# Patient Record
Sex: Male | Born: 1955 | Race: White | Hispanic: No | Smoking: Never smoker
Health system: Southern US, Community
[De-identification: ages and names within clinical notes are randomized; demographics above are authoritative.]

## PROBLEM LIST (undated history)

## (undated) DIAGNOSIS — Z9889 Other specified postprocedural states: Secondary | ICD-10-CM

## (undated) DIAGNOSIS — T4145XA Adverse effect of unspecified anesthetic, initial encounter: Secondary | ICD-10-CM

## (undated) DIAGNOSIS — K4090 Unilateral inguinal hernia, without obstruction or gangrene, not specified as recurrent: Secondary | ICD-10-CM

## (undated) DIAGNOSIS — Z87442 Personal history of urinary calculi: Secondary | ICD-10-CM

## (undated) DIAGNOSIS — T8859XA Other complications of anesthesia, initial encounter: Secondary | ICD-10-CM

## (undated) DIAGNOSIS — R112 Nausea with vomiting, unspecified: Secondary | ICD-10-CM

## (undated) HISTORY — PX: VASECTOMY: SHX75

---

## 1998-03-11 ENCOUNTER — Emergency Department (HOSPITAL_COMMUNITY): Admission: EM | Admit: 1998-03-11 | Discharge: 1998-03-11 | Payer: Self-pay | Admitting: Emergency Medicine

## 1998-03-12 ENCOUNTER — Encounter: Payer: Self-pay | Admitting: Emergency Medicine

## 2001-06-05 ENCOUNTER — Encounter: Payer: Self-pay | Admitting: Urology

## 2001-06-05 ENCOUNTER — Ambulatory Visit (HOSPITAL_BASED_OUTPATIENT_CLINIC_OR_DEPARTMENT_OTHER): Admission: RE | Admit: 2001-06-05 | Discharge: 2001-06-05 | Payer: Self-pay | Admitting: Urology

## 2003-03-30 ENCOUNTER — Ambulatory Visit (HOSPITAL_COMMUNITY): Admission: RE | Admit: 2003-03-30 | Discharge: 2003-03-30 | Payer: Self-pay | Admitting: Ophthalmology

## 2004-07-24 ENCOUNTER — Ambulatory Visit: Payer: Self-pay | Admitting: Internal Medicine

## 2005-10-18 ENCOUNTER — Ambulatory Visit: Payer: Self-pay | Admitting: Internal Medicine

## 2006-12-07 ENCOUNTER — Encounter: Payer: Self-pay | Admitting: *Deleted

## 2007-06-11 ENCOUNTER — Encounter: Payer: Self-pay | Admitting: *Deleted

## 2007-06-11 DIAGNOSIS — K649 Unspecified hemorrhoids: Secondary | ICD-10-CM | POA: Insufficient documentation

## 2007-06-11 DIAGNOSIS — Z9889 Other specified postprocedural states: Secondary | ICD-10-CM | POA: Insufficient documentation

## 2007-09-08 ENCOUNTER — Telehealth: Payer: Self-pay | Admitting: Internal Medicine

## 2008-05-19 ENCOUNTER — Ambulatory Visit (HOSPITAL_BASED_OUTPATIENT_CLINIC_OR_DEPARTMENT_OTHER): Admission: RE | Admit: 2008-05-19 | Discharge: 2008-05-19 | Payer: Self-pay | Admitting: Urology

## 2008-05-19 ENCOUNTER — Encounter (INDEPENDENT_AMBULATORY_CARE_PROVIDER_SITE_OTHER): Payer: Self-pay | Admitting: Urology

## 2008-09-27 ENCOUNTER — Telehealth: Payer: Self-pay | Admitting: Internal Medicine

## 2008-10-18 ENCOUNTER — Ambulatory Visit: Payer: Self-pay | Admitting: Gastroenterology

## 2008-10-20 ENCOUNTER — Telehealth: Payer: Self-pay | Admitting: Gastroenterology

## 2008-11-01 ENCOUNTER — Ambulatory Visit: Payer: Self-pay | Admitting: Gastroenterology

## 2008-11-15 ENCOUNTER — Ambulatory Visit: Payer: Self-pay | Admitting: Internal Medicine

## 2008-11-16 LAB — CONVERTED CEMR LAB
ALT: 15 units/L (ref 0–53)
AST: 19 units/L (ref 0–37)
Albumin: 4.2 g/dL (ref 3.5–5.2)
Alkaline Phosphatase: 69 units/L (ref 39–117)
BUN: 22 mg/dL (ref 6–23)
Basophils Absolute: 0 10*3/uL (ref 0.0–0.1)
Basophils Relative: 0.3 % (ref 0.0–3.0)
Bilirubin Urine: NEGATIVE
Bilirubin, Direct: 0.1 mg/dL (ref 0.0–0.3)
CO2: 29 meq/L (ref 19–32)
Calcium: 9 mg/dL (ref 8.4–10.5)
Chloride: 106 meq/L (ref 96–112)
Cholesterol: 188 mg/dL (ref 0–200)
Creatinine, Ser: 1.3 mg/dL (ref 0.4–1.5)
Eosinophils Absolute: 0.1 10*3/uL (ref 0.0–0.7)
Eosinophils Relative: 1.5 % (ref 0.0–5.0)
GFR calc non Af Amer: 61.83 mL/min (ref >60)
Glucose, Bld: 93 mg/dL (ref 70–99)
HCT: 47.2 % (ref 39.0–52.0)
HDL: 34.2 mg/dL — ABNORMAL LOW (ref >39.00)
Hemoglobin, Urine: NEGATIVE
Hemoglobin: 16.1 g/dL (ref 13.0–17.0)
Ketones, ur: NEGATIVE mg/dL
LDL Cholesterol: 126 mg/dL — ABNORMAL HIGH (ref 0–99)
Leukocytes, UA: NEGATIVE
Lymphocytes Relative: 24 % (ref 12.0–46.0)
Lymphs Abs: 1.8 10*3/uL (ref 0.7–4.0)
MCHC: 34.1 g/dL (ref 30.0–36.0)
MCV: 91.3 fL (ref 78.0–100.0)
Monocytes Absolute: 0.6 10*3/uL (ref 0.1–1.0)
Monocytes Relative: 7.7 % (ref 3.0–12.0)
Neutro Abs: 5 10*3/uL (ref 1.4–7.7)
Neutrophils Relative %: 66.5 % (ref 43.0–77.0)
Nitrite: NEGATIVE
PSA: 0.54 ng/mL (ref 0.10–4.00)
Platelets: 141 10*3/uL — ABNORMAL LOW (ref 150.0–400.0)
Potassium: 4.4 meq/L (ref 3.5–5.1)
RBC: 5.17 M/uL (ref 4.22–5.81)
RDW: 12 % (ref 11.5–14.6)
Sodium: 141 meq/L (ref 135–145)
Specific Gravity, Urine: 1.02 (ref 1.000–1.030)
TSH: 5.68 microintl units/mL — ABNORMAL HIGH (ref 0.35–5.50)
Total Bilirubin: 0.9 mg/dL (ref 0.3–1.2)
Total CHOL/HDL Ratio: 5
Total Protein, Urine: NEGATIVE mg/dL
Total Protein: 6.6 g/dL (ref 6.0–8.3)
Triglycerides: 140 mg/dL (ref 0.0–149.0)
Urine Glucose: NEGATIVE mg/dL
Urobilinogen, UA: 0.2 (ref 0.0–1.0)
VLDL: 28 mg/dL (ref 0.0–40.0)
WBC: 7.5 10*3/uL (ref 4.5–10.5)
pH: 6 (ref 5.0–8.0)

## 2008-11-22 ENCOUNTER — Ambulatory Visit: Payer: Self-pay | Admitting: Internal Medicine

## 2008-11-22 ENCOUNTER — Telehealth (INDEPENDENT_AMBULATORY_CARE_PROVIDER_SITE_OTHER): Payer: Self-pay | Admitting: *Deleted

## 2008-11-22 DIAGNOSIS — N508 Other specified disorders of male genital organs: Secondary | ICD-10-CM | POA: Insufficient documentation

## 2009-03-11 ENCOUNTER — Ambulatory Visit: Payer: Self-pay | Admitting: Internal Medicine

## 2009-03-11 DIAGNOSIS — M545 Low back pain, unspecified: Secondary | ICD-10-CM | POA: Insufficient documentation

## 2009-03-11 IMAGING — CR DG HIP COMPLETE 2+V*R*
3 series · 3 of 3 positions shown · non-contrast
Comparison: None

CLINICAL DATA: Right hip pain.

RIGHT HIP - COMPLETE 2+ VIEW

[view not recorded (1 of 3)]
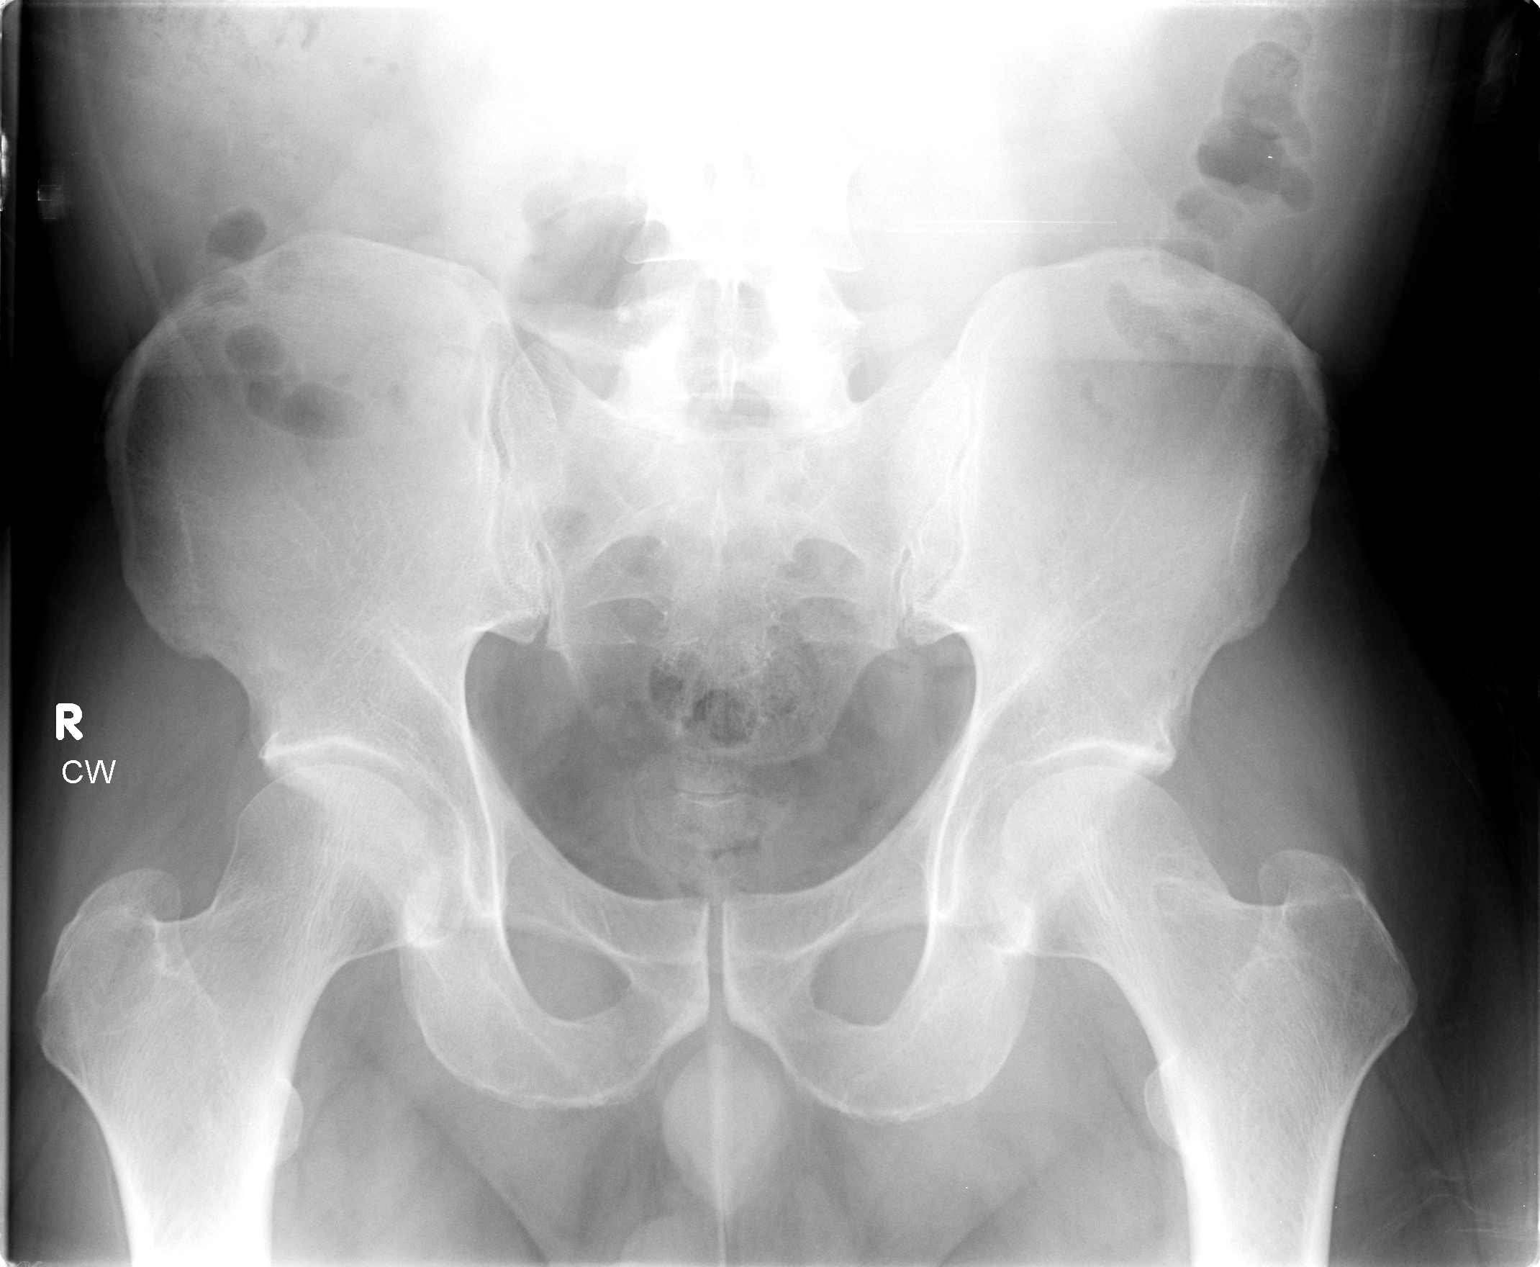

[view not recorded (2 of 3)]
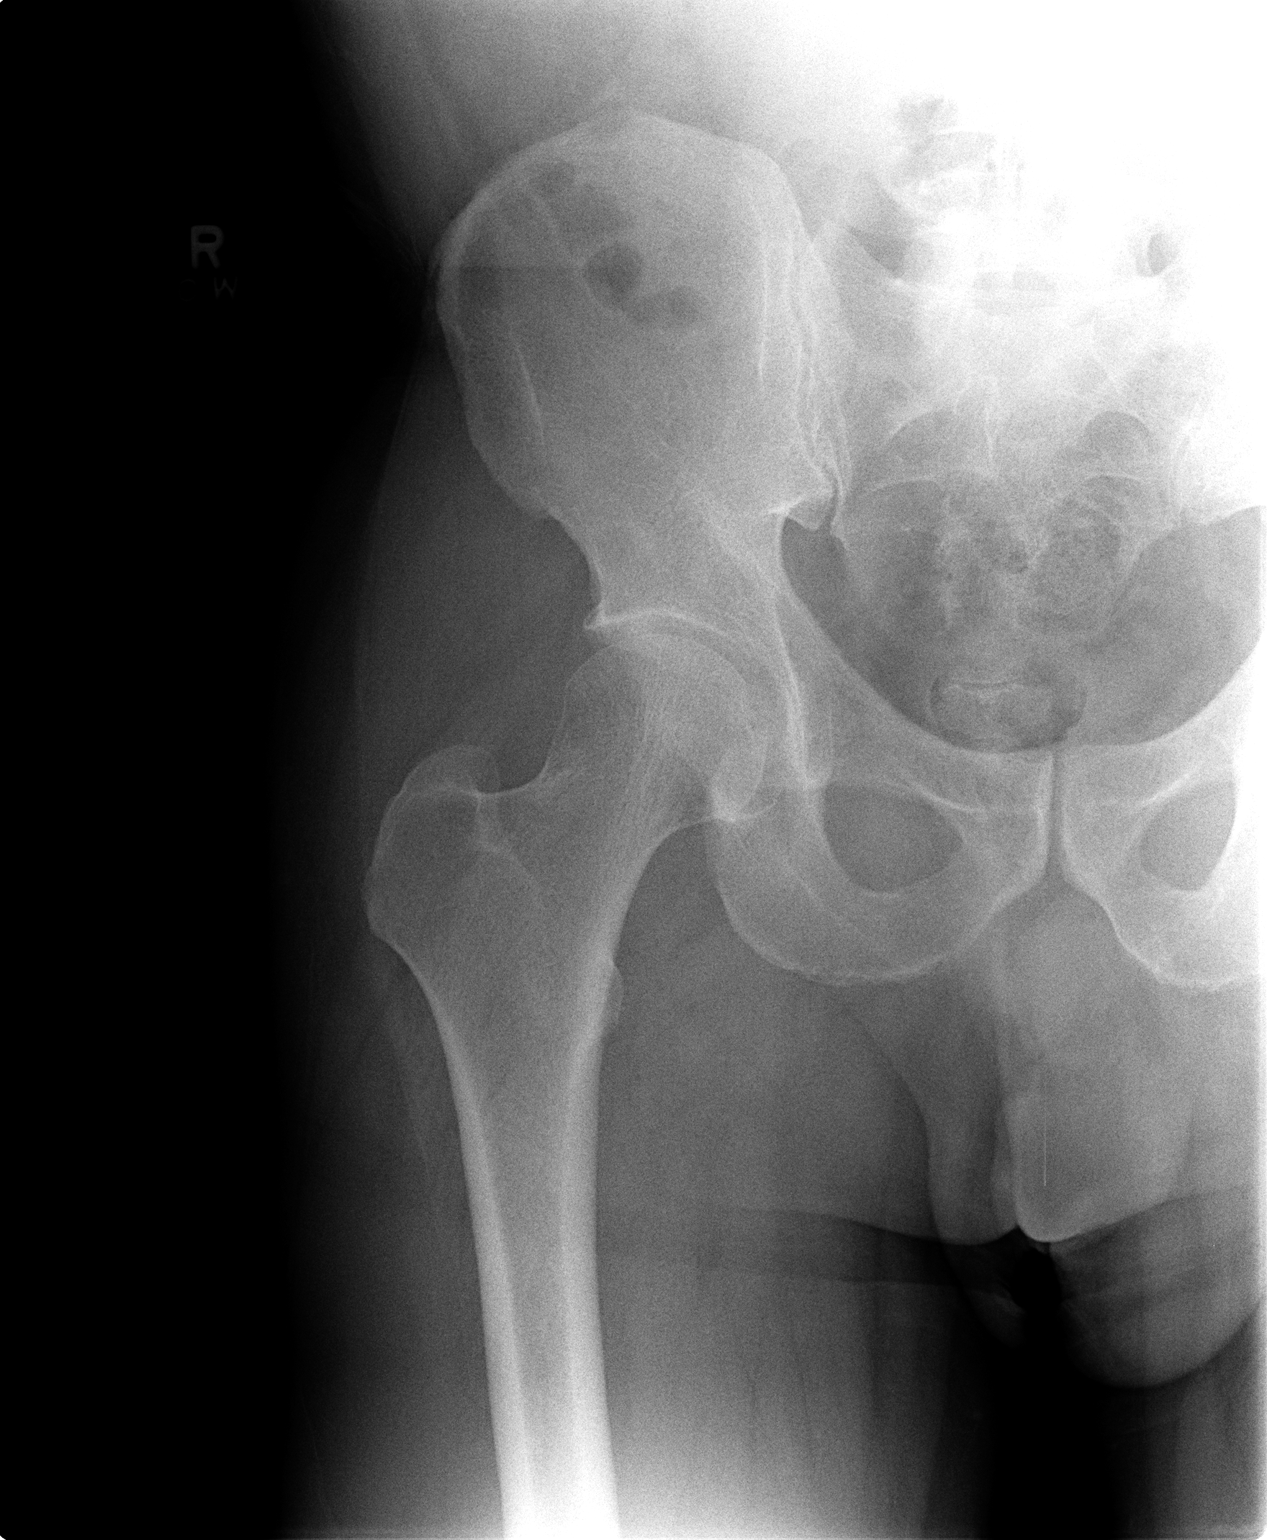

[view not recorded (3 of 3)]
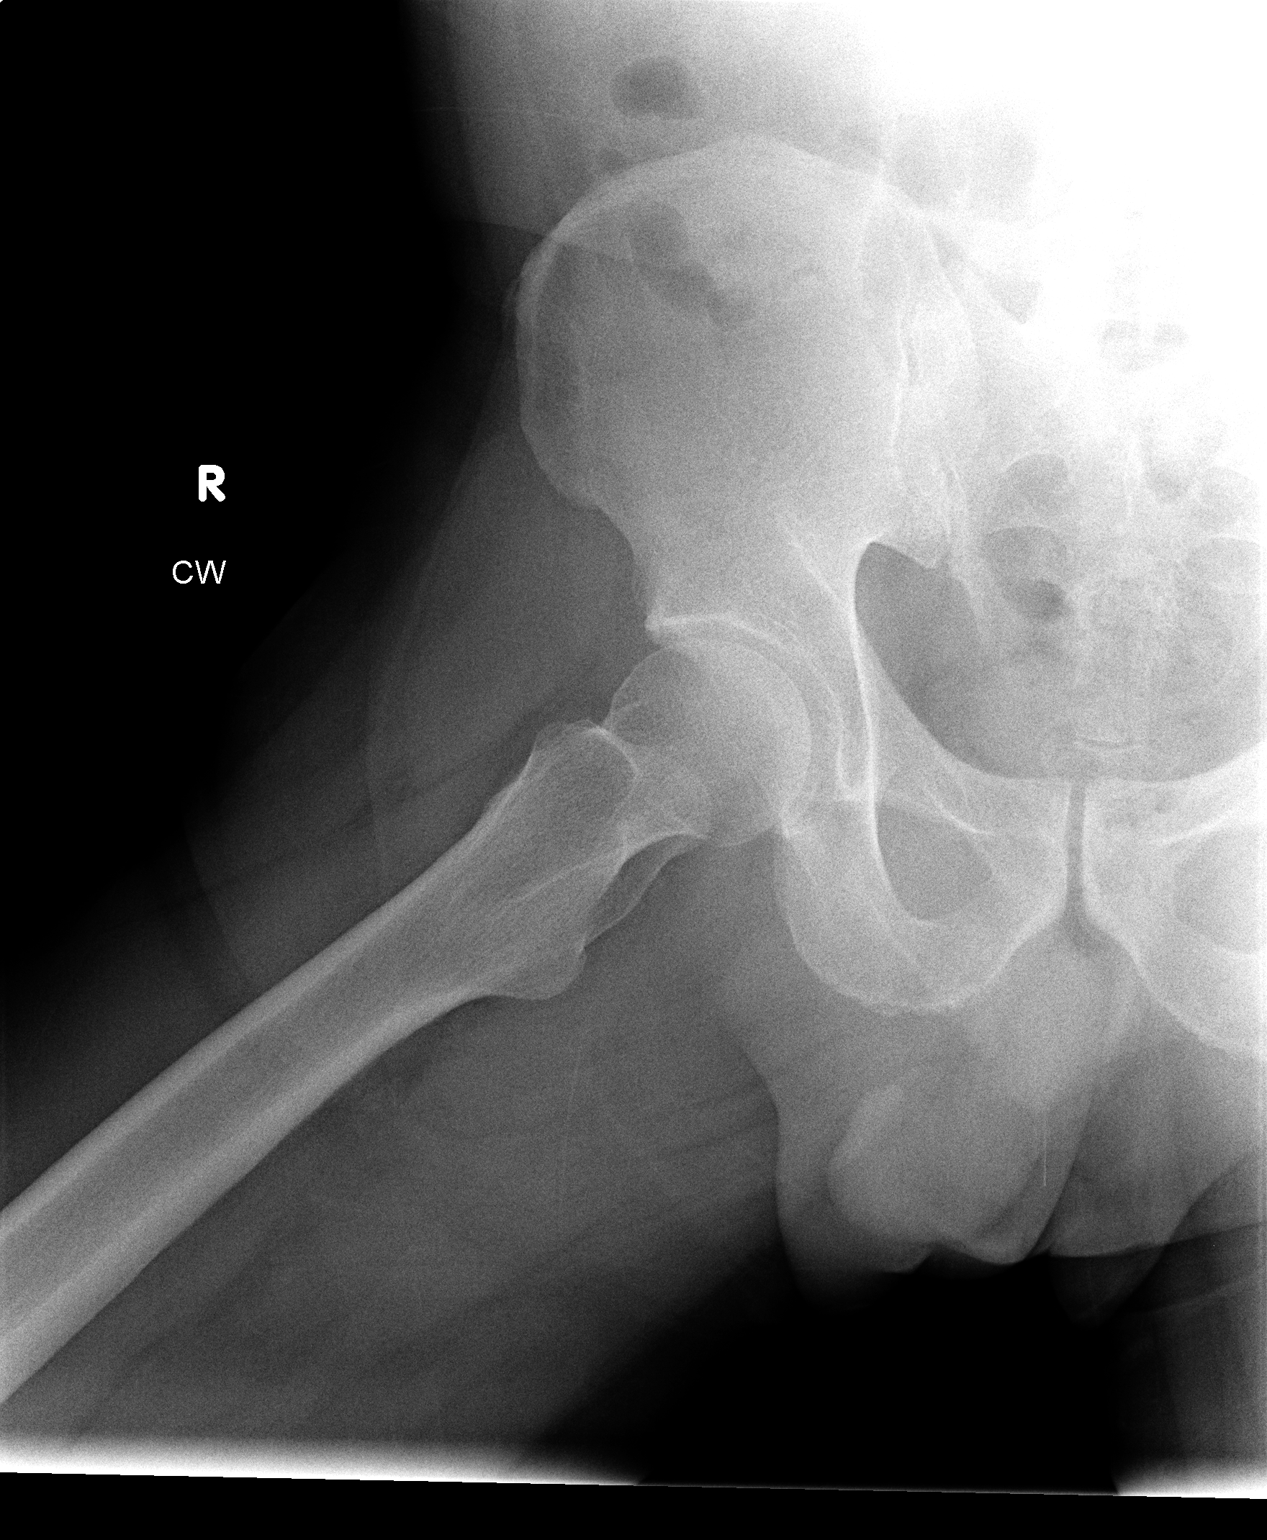

[3 of 3 positions shown; findings below may reference images not displayed]

FINDINGS: No evidence of acute fracture, subluxation or dislocation
identified.

No radio-opaque foreign bodies are present.

No focal bony lesions are noted.

The joint spaces are unremarkable.
IMPRESSION: Unremarkable right hip.

## 2009-03-11 IMAGING — CR DG SI JOINTS 3+V
4 series · 4 of 4 positions shown · non-contrast
Comparison: None

CLINICAL DATA: Low back pain.  Right sacroiliac pain.

BILATERAL SACROILIAC JOINTS - 3+ VIEW

[view not recorded (1 of 4)]
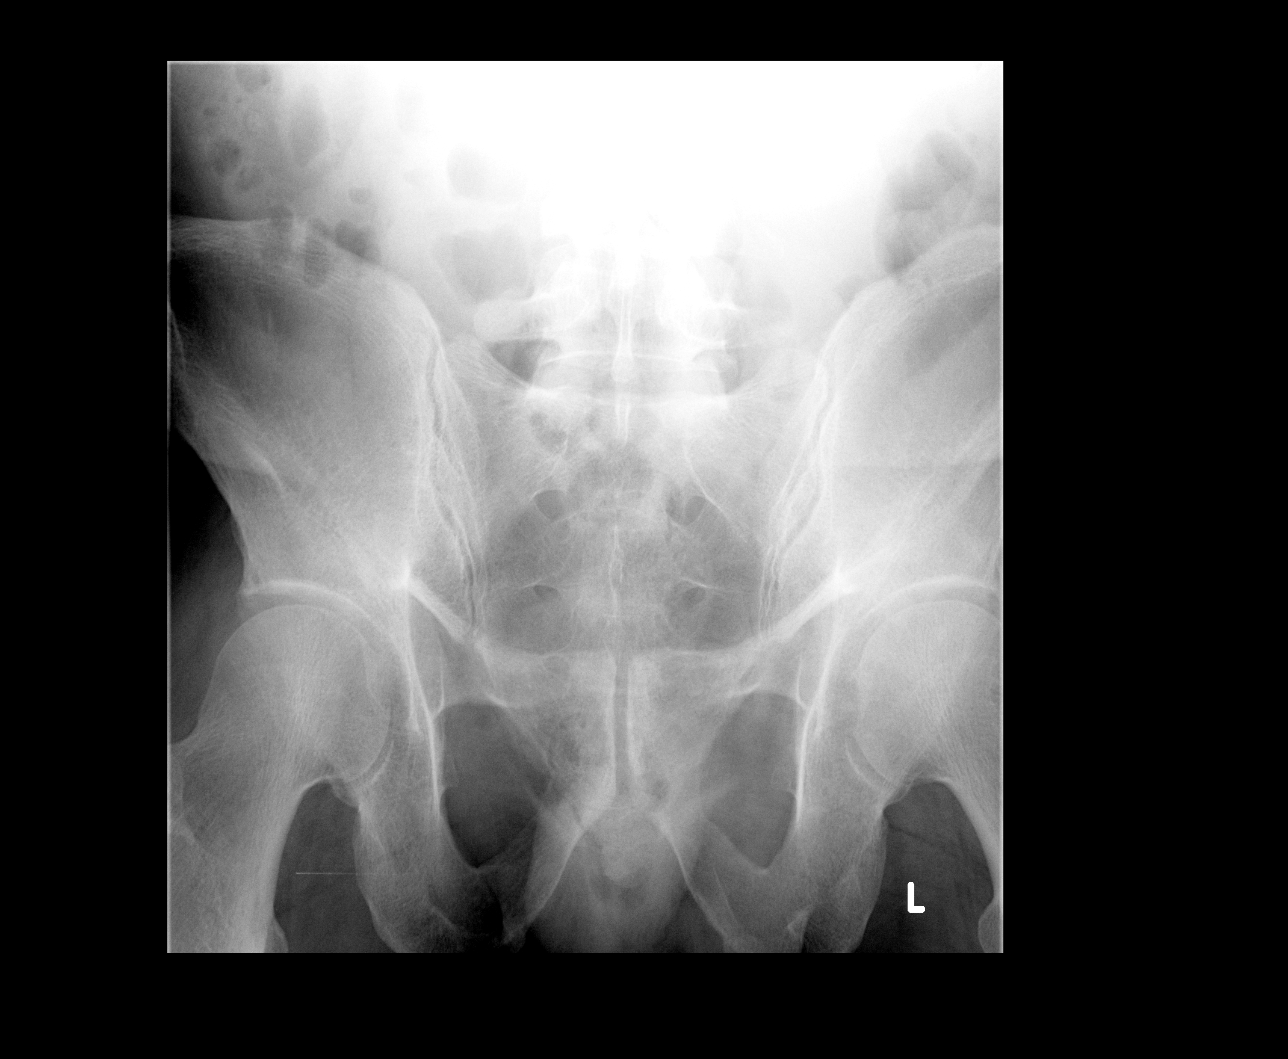

[view not recorded (2 of 4)]
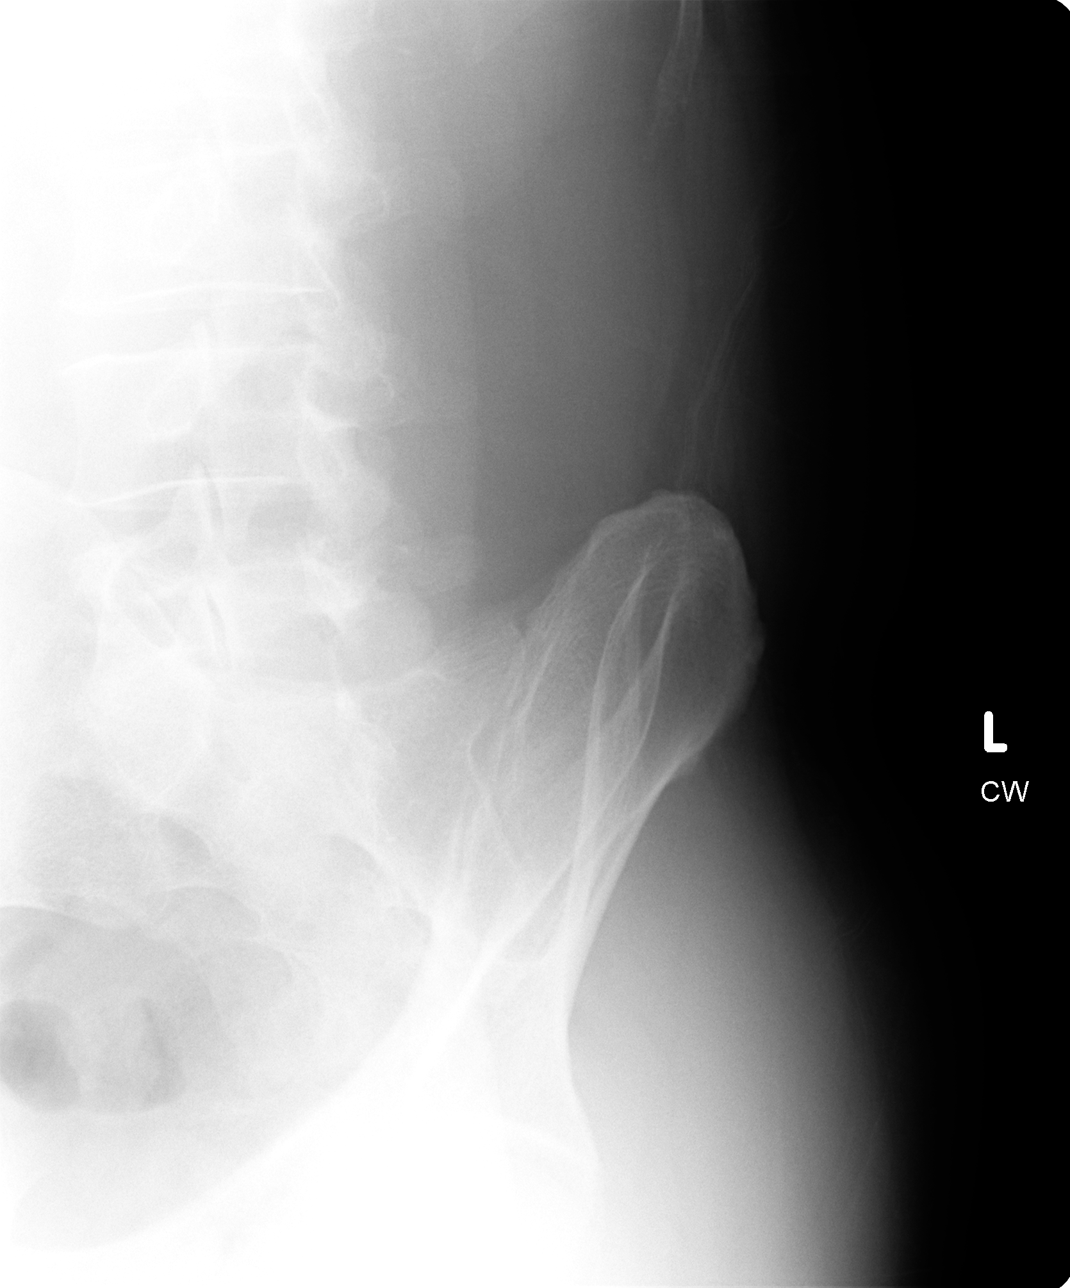

[view not recorded (3 of 4)]
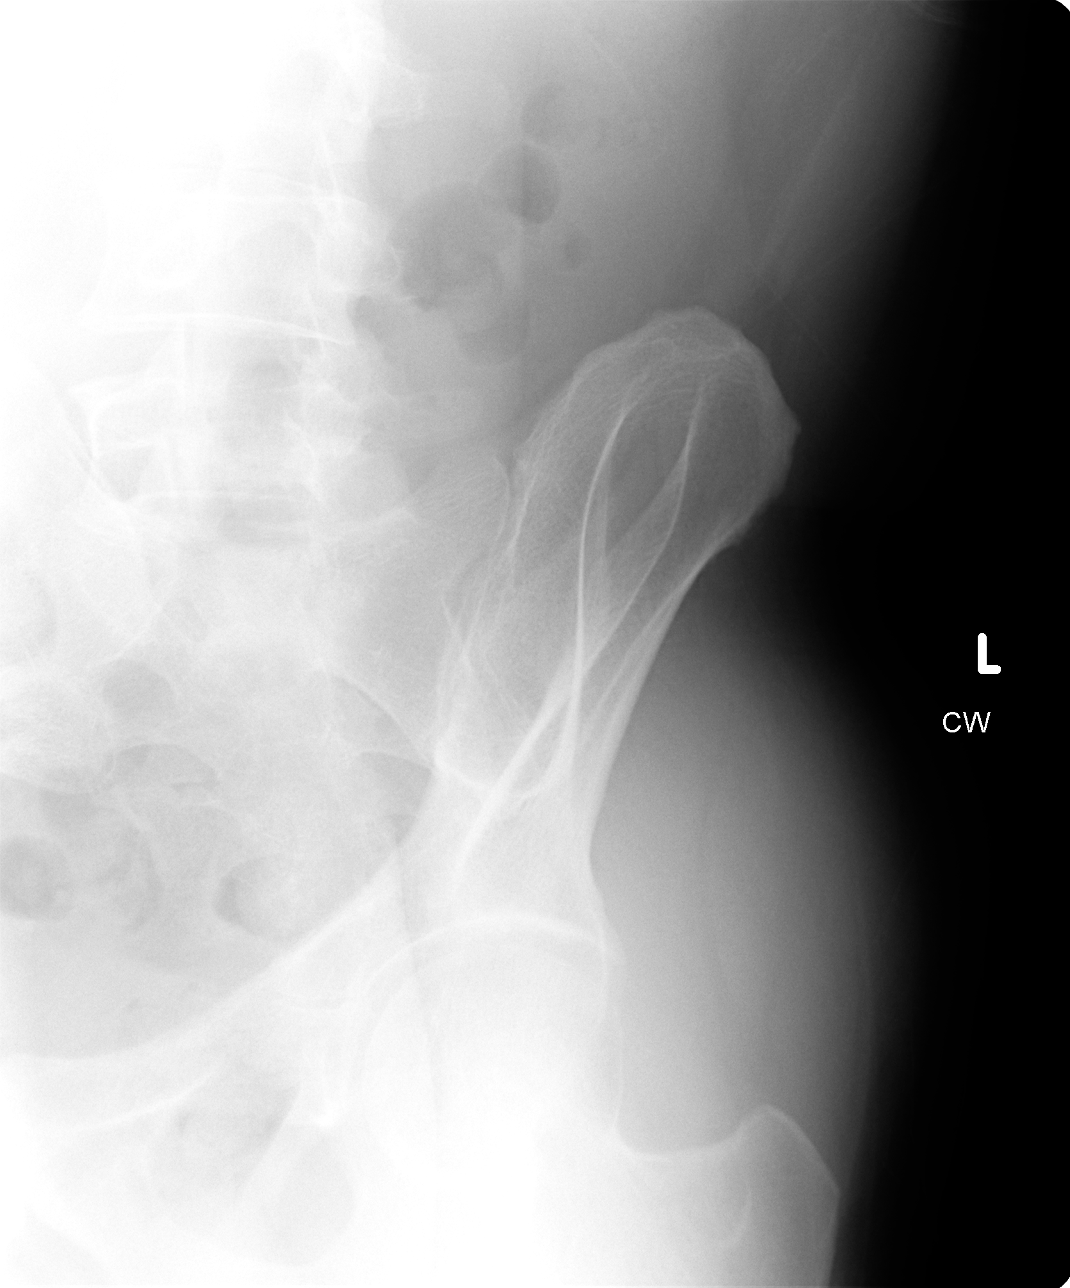

[view not recorded (4 of 4)]
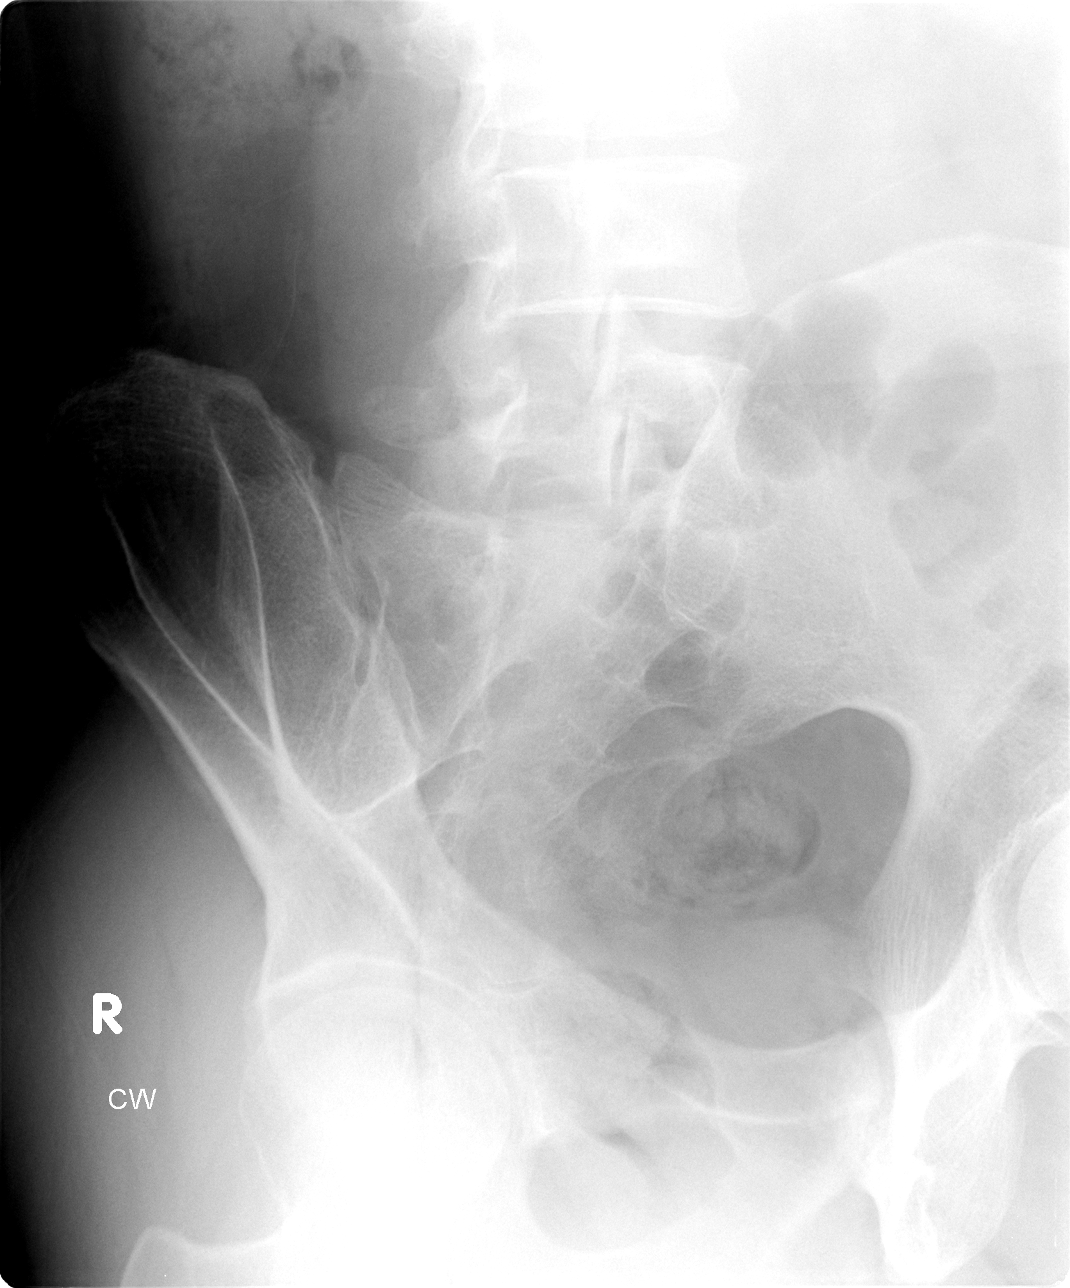

[4 of 4 positions shown; findings below may reference images not displayed]

FINDINGS: The sacroiliac joints appear normal.  No sign of
degenerative or inflammatory arthritis.  Symphysis pubis appears
normal.
IMPRESSION: Normal radiographs

## 2009-03-12 ENCOUNTER — Telehealth: Payer: Self-pay | Admitting: Internal Medicine

## 2009-08-25 ENCOUNTER — Ambulatory Visit: Payer: Self-pay | Admitting: Internal Medicine

## 2009-08-25 ENCOUNTER — Telehealth (INDEPENDENT_AMBULATORY_CARE_PROVIDER_SITE_OTHER): Payer: Self-pay | Admitting: *Deleted

## 2009-08-29 ENCOUNTER — Ambulatory Visit: Payer: Self-pay | Admitting: Internal Medicine

## 2009-08-30 ENCOUNTER — Encounter: Payer: Self-pay | Admitting: Internal Medicine

## 2009-09-08 ENCOUNTER — Ambulatory Visit: Payer: Self-pay | Admitting: Internal Medicine

## 2009-10-04 ENCOUNTER — Encounter: Payer: Self-pay | Admitting: Internal Medicine

## 2010-03-07 NOTE — Letter (Signed)
Summary: [facility]   Imported By: Lester [place] 10/13/2009 09:31:14  _____________________________________________________________________  External Attachment:    Type:   Image     Comment:   External Document

## 2010-03-07 NOTE — Progress Notes (Signed)
  Phone Note Other Incoming   Caller: pt  Summary of Call: Pt called and states that [facility] on [address] did not have proctazone cream. Pt wanted cream called into [facility] on [address]. I called and spoke with pharmacist and called this medication in for pt Initial call taken by: Ami Bullins CMA,  August 25, 2009 10:28 AM

## 2010-03-07 NOTE — Assessment & Plan Note (Signed)
Summary: FU Natale Milch  #--hemrrhoid problem also--stc   Vital Signs:  Patient profile:   55 year old male Height:      66 inches (167.64 cm) Weight:      169.50 pounds (77.05 kg) BMI:     27.46 O2 Sat:      97 % on Room air Temp:     98.6 degrees F (37.00 degrees C) oral Pulse rate:   68 / minute BP sitting:   130 / 88  (left arm) Cuff size:   regular  Vitals Entered By: Brenton Grills (August 29, 2009 3:29 PM)  O2 Flow:  Room air CC: F/U for hemorrhoids/aj   Primary Care Provider:  Jacques Navy MD  CC:  F/U for hemorrhoids/aj.  History of Present Illness: Patient returns for follow-up of hemorrhoid. At his last visit attempt at Incision for removal of thrombis failed. Over the W/E he has been doing sitz baths and using anusol HC 2.5% cream. He is worse. He reports that he has very painful BMs, has pain all the time and has developed a second hemorrhoid.  Current Medications (verified): 1)  Saw Palmetto Complex   Caps (Zn-Pyg Afri-Nettle-Saw Palmet) 2)  Transderm-Scop 1.5 Mg  Pt72 (Scopolamine Base) .... Apply 1 Q 72 Hrs, Start 1 Day Prior To Cruising 3)  Proctozone-Hc 2.5 % Crea (Hydrocortisone) .... Apploy After Each Sitz Bath (Qid) For Hemorrhoid Treatment  Allergies (verified): No Known Drug Allergies  Past History:  Past Medical History: Last updated: 06/11/2007 * Hx of SEVERE DERMATOCHALASIS WITH VISUAL IMPAIRMENT. Hx of SEBACEOUS CYST, NECK EXCISION. (ICD-706.2) Hx of HEMORRHOIDS (ICD-455.6)  Past Surgical History: Last updated: 11/22/2008 * Hx of UPPER EYELID BLEPHAROPLASTIES INGUINAL HERNIORRHAPHY, RIGHT, HX OF (ICD-V45.89) VASECTOMY, HX OF (ICD-V26.52) Excision of epididymal cyst.      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  Physical Exam  General:  alert, well-developed, well-nourished, and well-hydrated.   Lungs:  normal respiratory effort.   Heart:  normal rate and regular rhythm.   Rectal:  Patient with previous large hemorrhoid right side with no evidence of infection from procedure. Has a "kissing" hemorrhoid on the left of anus. Both are very large, exquisitly tender.    Impression & Recommendations:  Problem # 1:  HEMORRHOID, EXTERNAL, THROMBOSED (ICD-455.4) Worsening hemorrhoids that have not responded to treatment. He is very uncomfortable   Plan - patient referred to Dr. Darnell Level - Tuesday 08/30/09 at 4:30 PM. Patient given instructions.           continue sitz baths and anusol cream.  Complete Medication List: 1)  Saw Palmetto Complex Caps (Zn-pyg afri-nettle-saw palmet) 2)  Transderm-scop 1.5 Mg Pt72 (Scopolamine base) .... Apply 1 q 72 hrs, start 1 day prior to cruising 3)  Proctozone-hc 2.5 % Crea (Hydrocortisone) .... Apploy after each sitz bath (qid) for hemorrhoid treatment

## 2010-03-07 NOTE — Assessment & Plan Note (Signed)
Summary: HEMORRHOID IS SWOLLEN AND BLEEDING/NWS   Vital Signs:  Patient profile:   55 year old male Height:      66 inches Weight:      172 pounds BMI:     27.86 O2 Sat:      96 % on Room air Temp:     98.0 degrees F oral Pulse rate:   61 / minute BP sitting:   118 / 78  (left arm) Cuff size:   regular  Vitals Entered By: Bill Salinas CMA (August 25, 2009 9:10 AM)  O2 Flow:  Room air  CC: pt here for evaluation of sore bleeding hemorrhoids/ ab   Primary Care Provider:  Jacques Navy MD  CC:  pt here for evaluation of sore bleeding hemorrhoids/ ab.  History of Present Illness: patient presents with an acute thrombosed hemorrhoid. He has had this in the past. He said it developed 2 days ago but has not gotten better with sitz baths. He is very uncomfortable.   Current Medications (verified): 1)  Saw Palmetto Complex   Caps (Zn-Pyg Afri-Nettle-Saw Palmet) 2)  Transderm-Scop 1.5 Mg  Pt72 (Scopolamine Base) .... Apply 1 Q 72 Hrs, Start 1 Day Prior To Cruising  Allergies (verified): No Known Drug Allergies PMH-FH-SH reviewed-no changes except otherwise noted  Review of Systems  The patient denies anorexia, fever, chest pain, dyspnea on exertion, abdominal pain, genital sores, difficulty walking, and enlarged lymph nodes.    Physical Exam  General:  Well-developed,well-nourished,in no acute distress; alert,appropriate and cooperative throughout examination Rectal:  Large hemorrhoid left side of anus with blue point very close to the anus and significant edema of the vein.  Msk:  normal ROM.   Neurologic:  alert & oriented X3 and cranial nerves II-XII intact.   Skin:  large hemorrhoid Psych:  Oriented X3.     Impression & Recommendations:  Problem # 1:  HEMORRHOID, EXTERNAL, THROMBOSED (ICD-455.4) Patient with a large, edematous thrombosed hemorrhoid. Incision -drainage of clot unsuccessful. consulted with surgeon - recommended no further incision; medical therapy  recommended with sitz baths 3-4 times a day, anusol HC 2.5% cream, stool softener.  Plan - as above           patient instructed to watch for signs of infection           ROV Tuesday, July 26th           out of work through the 29th            Complete Medication List: 1)  Saw Palmetto Complex Caps (Zn-pyg afri-nettle-saw palmet) 2)  Transderm-scop 1.5 Mg Pt72 (Scopolamine base) .... Apply 1 q 72 hrs, start 1 day prior to cruising 3)  Proctozone-hc 2.5 % Crea (Hydrocortisone) .... Apploy after each sitz bath (qid) for hemorrhoid treatment  Patient Instructions: 1)  Large hemorrhoid - was unable to successfully remove clot. Surgeon recommended no further "cutting". Plan 1) sitz bath 3-4 times a day 2) wash gently with soap and water/wash cloth 3) apply anusol HC 2.5% cream after each sitz bath 4) stay off your feet. Watch for fever, pus like drainage, increased. Stool softener - like senekot 2 tabs at bedtime or something like metamucil. Return next Tuesday for recheck.  Prescriptions: PROCTOZONE-HC 2.5 % CREA (HYDROCORTISONE) apploy after each sitz bath (qid) for hemorrhoid treatment  #60 g x 1   Entered and Authorized by:   Jacques Navy MD   Signed by:   Jacques Navy MD on  08/25/2009   Method used:   Electronically to        [facility] [id number]* (retail)       [address]       [place], [place]  [address]       Ph: [phone]       Fax: [phone]   RxID:   [id number]    Procedure Note Last Tetanus: Td (02/07/2003)  Incision & Drainage: The patient complains of pain, redness, and inflammation. Indication: inflamed lesion  Procedure # 1: I & D    Size (in cm): 1.0 x 3.0    Location: left perirectal hemorrhoid    Comment: informed verbal consent for I&D thrombosed hemorrhoid.    Instrument used: #11 blade    Anesthesia: 2% xylocaine  Cleaned and prepped with: betadine Additional Instructions: very large thrombosed hemorrhoid with edema. No clot  released after incision. see APOE

## 2010-03-07 NOTE — Progress Notes (Signed)
  Phone Note Outgoing Call   Reason for Call: Discuss lab or test results Summary of Call: Please - call patient: x-rays were normal. For continued pain we can attempt to get relief with steroid injection to SI joint.   Thanks Initial call taken by: Jacques Navy MD,  March 12, 2009 11:05 AM  Follow-up for Phone Call        lm for pt to call back Follow-up by: Ami Bullins CMA,  March 14, 2009 8:58 AM  Additional Follow-up for Phone Call Additional follow up Details #1::        spoke with pt and he stated that the pain is still there but he would wait to see how bad his pain got and call us back to see if he wanted to proceed with joint injection Additional Follow-up by: Ami Bullins CMA,  March 14, 2009 4:03 PM

## 2010-03-07 NOTE — Consult Note (Signed)
Summary: [facility]   Imported By: Lennie Odor 09/16/2009 12:23:09  _____________________________________________________________________  External Attachment:    Type:   Image     Comment:   External Document

## 2010-03-07 NOTE — Letter (Signed)
Summary: Out of Work  [facility]  [address] [place], [place] [address]   Phone: [phone]  Fax: [phone]    August 25, 2009   Employee:  Albert Rivas    To Whom It May Concern:   For Medical reasons, please excuse the above named employee from work for the following dates:  Start:   Thursday, August 25, 2009  End:   Friday, July 29th  If you need additional information, please feel free to contact our office.         Sincerely,    Jacques Navy MD

## 2010-03-07 NOTE — Assessment & Plan Note (Signed)
Summary: r hip pain/#/cd   Vital Signs:  Patient profile:   55 year old male Height:      66 inches Weight:      180 pounds BMI:     29.16 O2 Sat:      96 % on Room air Temp:     97.9 degrees F oral Pulse rate:   65 / minute BP sitting:   122 / 82  (left arm) Cuff size:   regular  Vitals Entered By: Bill Salinas CMA (March 11, 2009 1:06 PM)  O2 Flow:  Room air CC: pt here with complaint of Right hip pain x 1 year off and on/ ab   Primary Care Provider:  Jacques Navy MD  CC:  pt here with complaint of Right hip pain x 1 year off and on/ ab.  History of Present Illness: Patient with a year of pain in the right back in the region of the SI joint and radiation to the hip and groin. The pain does not interfere with walking, no trouble with step-climbing, no pain at night if he doesn't lay on the right side,  no paresthesia, no weakness to the right leg. No relief with otc NSAIDs.  Current Medications (verified): 1)  Saw Palmetto Complex   Caps (Zn-Pyg Afri-Nettle-Saw Palmet) 2)  Transderm-Scop 1.5 Mg  Pt72 (Scopolamine Base) .... Apply 1 Q 72 Hrs, Start 1 Day Prior To Cruising  Allergies (verified): No Known Drug Allergies  Past History:  Past Medical History: Last updated: 06/11/2007 * Hx of SEVERE DERMATOCHALASIS WITH VISUAL IMPAIRMENT. Hx of SEBACEOUS CYST, NECK EXCISION. (ICD-706.2) Hx of HEMORRHOIDS (ICD-455.6)  Past Surgical History: Last updated: 11/22/2008 * Hx of UPPER EYELID BLEPHAROPLASTIES INGUINAL HERNIORRHAPHY, RIGHT, HX OF (ICD-V45.89) VASECTOMY, HX OF (ICD-V26.52) Excision of epididymal cyst.      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    Physical Exam  General:  Well-developed,well-nourished,in no acute distress; alert,appropriate and cooperative throughout examination Msk:  normal ROM hips, no tenderness with internal or external rotation of the hips, tender over the right SI joint.    Impression & Recommendations:  Problem # 1:  LOW BACK PAIN, CHRONIC (ICD-724.2) Exam is suggestive of inflammation of the right sacro-iliac joint. Hip exam was normal.  Plan - right hip films and sacro-iliac films.  Orders: T-Hip Comp Right Min 2 views (73510TC) T-Sacroiliac Joints (72202TC)  Addendum - radiographs normal  Plan - patient may return for SI joint injection.  Complete Medication List: 1)  Saw Palmetto Complex Caps (Zn-pyg afri-nettle-saw palmet) 2)  Transderm-scop 1.5 Mg Pt72 (Scopolamine base) .... Apply 1 q 72 hrs, start 1 day prior to cruising

## 2010-05-17 LAB — POCT HEMOGLOBIN-HEMACUE: Hemoglobin: 16.4 g/dL (ref 13.0–17.0)

## 2010-06-20 NOTE — Op Note (Signed)
Albert Rivas, Albert Rivas                ACCOUNT NO.:  [id number]   MEDICAL RECORD NO.:  [id number]          PATIENT TYPE:  AMB   LOCATION:  [facility]                         FACILITY:  [facility]   PHYSICIAN:  Valetta Fuller, M.D.  DATE OF BIRTH:  September 28, 1955   DATE OF PROCEDURE:  05/19/2008  DATE OF DISCHARGE:                               OPERATIVE REPORT   PREOPERATIVE DIAGNOSIS:  Chronic right testicular pain.   POSTOPERATIVE DIAGNOSIS:  Chronic right testicular pain.   PROCEDURE PERFORMED:  Scrotal exploration with excision of small sperm  granuloma and excision of right epididymis.   ANESTHESIA:  General.   INDICATIONS:  [name] is 55 years of age.  The patient has remote  history of vasectomy but has suffered recently from significant chronic  right testicular pain.  He has come in on numerous occasions complaining  of chronic right testicular discomfort.  Clinically, he has not had a  lot of abnormalities other than significant discomfort and tenderness  around the right testicle.  He seems to have a small sperm granuloma  which is tender and also substantial ongoing tenderness around his  epididymis.  The patient has failed conservative therapy and was  interested in more definitive treatment if at all possible.  We talked  to him about scrotal exploration with possible epididymectomy.  The  patient understands that there is no guarantee that this will improve  his pain and it is conceivable that things could worsen.  He requested  anything be done if at all possible.  A scrotal ultrasound showed  nothing else of concern.   TECHNIQUE AND FINDINGS:  The patient was brought to the operating room  where he had successful induction of general anesthesia.  He was placed  in the supine position and prepped and draped in the usual manner.  Appropriate surgical time-out was performed.  A median raphe incision  was made and the right hemiscrotum was entered.  The testicle itself  showed no  abnormalities.  There appeared to be a marked chronic dilation  of the epididymal tubules and quite a bit of chronic induration and  firmness within the epididymis.  There was also a small sperm granuloma  noted at the vasectomy site.  The tunica vaginalis was widely opened.  With a combination of sharp dissective technique and electrocautery, the  epididymis was removed in its entirety.  The blood supply to the testis  remained intact and the testicle remained viable and pink throughout the  procedure.  The small granulomatous inflammation was removed at the site  of the vasectomy.  Of note, when the vas was cut, a large amount of  thick whitish material was obtained, suggesting that there was a fair  amount of pressure within the epididymis.  The epididymis was sent for  pathologic assessment.  The testis was copiously irrigated and then  carefully returned to the  right hemiscrotum.  Hemostasis was excellent.  The scrotum was closed  with several layers of Vicryl.  A Marcaine spermatic cord block was also  performed.  The patient appeared to tolerate the procedure  well.  There  are no obvious complications or difficulties.      Valetta Fuller, M.D.  Electronically Signed     DSG/MEDQ  D:  05/20/2008  T:  05/20/2008  Job:  [id number]

## 2010-06-23 NOTE — Op Note (Signed)
NAME:  Albert Rivas, Albert Rivas                          ACCOUNT NO.:  [id number]   MEDICAL RECORD NO.:  [id number]                   PATIENT TYPE:  OIB   LOCATION:  2861                                 FACILITY:  [facility]   PHYSICIAN:  Robert L. Dione Booze, M.D.               DATE OF BIRTH:  [birth year]   DATE OF PROCEDURE:  03/30/2003  DATE OF DISCHARGE:                                 OPERATIVE REPORT   INDICATIONS AND JUSTIFICATIONS FOR THE PROCEDURE:  [name] was seen most  recently in my office on January 13, 2003, through the referral of Dr. Charlann Noss and he is followed medically by Dr. Illene Regulus.  He is 55 years  old and could tell that he had a large amount of redundant skin blocking the  upper field of his vision in each eye.  He felt that this was causing some  strain and fatigue.  Pressures were 17 and the vision was 20/25 without  correction.  Confrontation field did show the reduction of the superior  field.  The skin actually covers the eyelashes and comes to within about 0.5  mm of the pupil.  The conjunctiva, cornea, lens, anterior chamber, and  fundus exam were unremarkable.  The problem was discussed and the patient  felt he wanted to have upper eyelid operative blepharoplasties in order to  open up his field of vision and alleviate his symptoms.  This is done for  medical reasons and not for cosmesis.   Justification for performing the procedure in an outpatient setting is  routine.   Justification for an overnight stay is none.   PREOPERATIVE DIAGNOSIS:  Severe dermatochalasis with visual impairment.   POSTOPERATIVE DIAGNOSIS:  Severe dermatochalasis with visual impairment.   OPERATION PERFORMED:  Upper eyelid blepharoplasties.   ANESTHESIA:  1% Xylocaine with epinephrine.   PROCEDURE:  The patient arrived in the minor surgery room at [facility] and was prepped and draped in the routine fashion.  The skin to be  removed was carefully demarcated and 1%  Xylocaine with epinephrine was given  in the skin of each upper eyelid.  The skin was carefully removed along with  some underlying fatty tissue and then bleeding was controlled with the  battery operated cautery.  Each wound was closed with a running 6-0 nylon  suture and Polysporin ointment was used on the wound.  Cold packs were  applied.  The patient left the operating room having done well.   FOLLOW UP:  The patient will be seen in my office in about six days to have  the sutures removed.  He is to use cold compresses today and warm compresses  several times daily after that.  He is to keep his head elevated today and  if there is noticeable bleeding, he is to hold firm pressure several minutes  at a time to control any bleeding.  Robert L. Dione Booze, M.D.    RLG/MEDQ  D:  03/30/2003  T:  03/30/2003  Job:  [id number]   cc:   Vincenza Hews, M.D.  [address]  [place]  [place] [address]  Fax: [phone]   Rosalyn Gess. Norins, M.D. [facility]

## 2011-02-21 ENCOUNTER — Telehealth: Payer: Self-pay | Admitting: *Deleted

## 2011-02-21 MED ORDER — HYDROCOD POLST-CHLORPHEN POLST 10-8 MG/5ML PO LQCR: 5.0000 mL | Freq: Two times a day (BID) | ORAL | Status: DC

## 2011-02-21 NOTE — Telephone Encounter (Signed)
ook for tussionex 4 oz, 1 tsp q12 hr for cough

## 2011-02-21 NOTE — Telephone Encounter (Signed)
Informed patient. Rx Done.

## 2011-02-21 NOTE — Telephone Encounter (Signed)
Pt c/o cough xWks w/o fever and/or mucus; requesting Rx for cough syrup "yellow tussin w/lemon taste, thick" that has worked in past to [employer]. Please advise.

## 2012-06-05 ENCOUNTER — Other Ambulatory Visit: Payer: Self-pay | Admitting: Internal Medicine

## 2012-06-05 ENCOUNTER — Telehealth: Payer: Self-pay

## 2012-06-05 NOTE — Telephone Encounter (Signed)
Phone call from pt asking why the prescription for Tussionex was denied. I let him know since it's been over 2 years since he's been seen he will need to be seen in the office before this can be refilled. Pt ended up handing up.

## 2013-10-20 ENCOUNTER — Encounter: Payer: Self-pay | Admitting: Gastroenterology

## 2016-03-06 ENCOUNTER — Other Ambulatory Visit: Payer: Self-pay | Admitting: Family Medicine

## 2016-03-06 DIAGNOSIS — R1012 Left upper quadrant pain: Secondary | ICD-10-CM

## 2016-03-06 DIAGNOSIS — R1032 Left lower quadrant pain: Secondary | ICD-10-CM | POA: Diagnosis not present

## 2016-03-06 DIAGNOSIS — J069 Acute upper respiratory infection, unspecified: Secondary | ICD-10-CM | POA: Diagnosis not present

## 2016-03-06 DIAGNOSIS — R05 Cough: Secondary | ICD-10-CM | POA: Diagnosis not present

## 2016-03-09 ENCOUNTER — Ambulatory Visit
Admission: RE | Admit: 2016-03-09 | Discharge: 2016-03-09 | Disposition: A | Discharge status: Home / Self Care | Payer: 59 | Source: Ambulatory Visit | Attending: Family Medicine | Admitting: Family Medicine

## 2016-03-09 DIAGNOSIS — K4090 Unilateral inguinal hernia, without obstruction or gangrene, not specified as recurrent: Secondary | ICD-10-CM | POA: Diagnosis not present

## 2016-03-09 DIAGNOSIS — R1012 Left upper quadrant pain: Secondary | ICD-10-CM

## 2016-03-09 IMAGING — CT CT ABD-PELV W/ CM
1 of 3 series · 14 of 32 positions shown, 19 images · IV contrast (APPLIED)
Comparison: None.

CLINICAL DATA: Left lower quadrant abdominal pain and bloating
radiating to the left leg.

EXAM:
CT ABDOMEN AND PELVIS WITH CONTRAST
TECHNIQUE: Multidetector CT imaging of the abdomen and pelvis was performed
using the standard protocol following bolus administration of
intravenous contrast.
CONTRAST:  100mL [id number] IOPAMIDOL ([id number]) INJECTION 61%

[Series 2: abd/pelvis w/cm · axial · 0.77mm/px · z∈[-512,-12]mm · 14 of 112 slices shown, 19 images]
[im 6/112  soft-tissue]
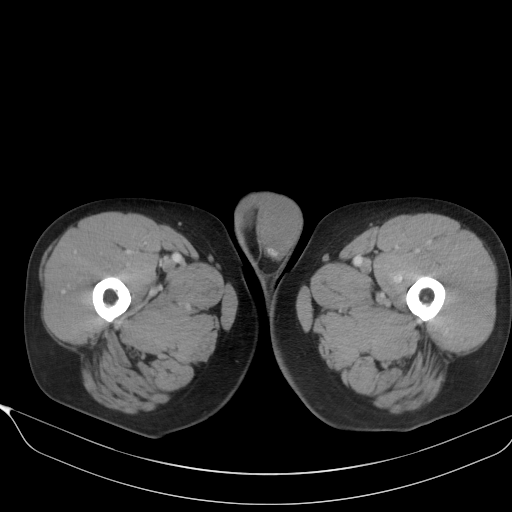
[im 6/112  bone]
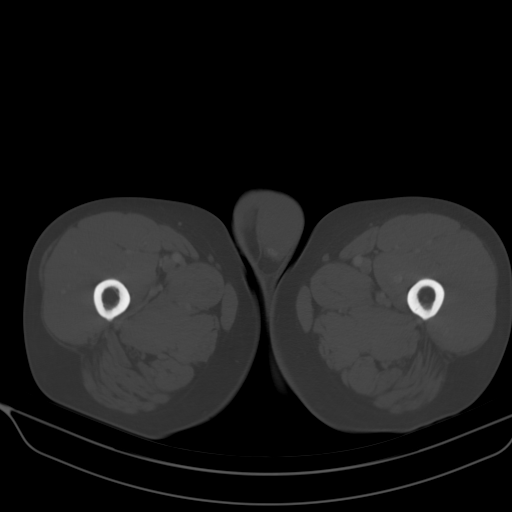
[im 17/112  soft-tissue]
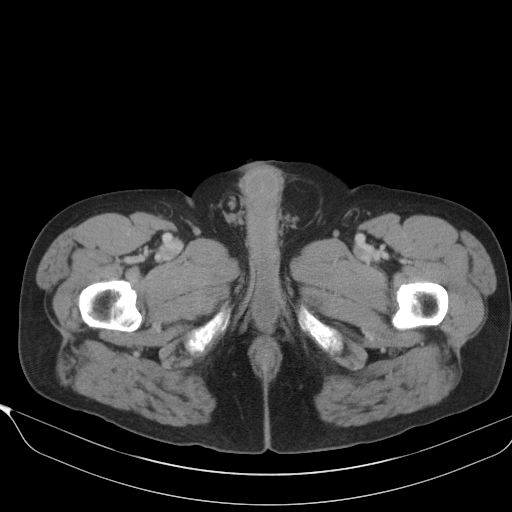
[im 23/112  soft-tissue]
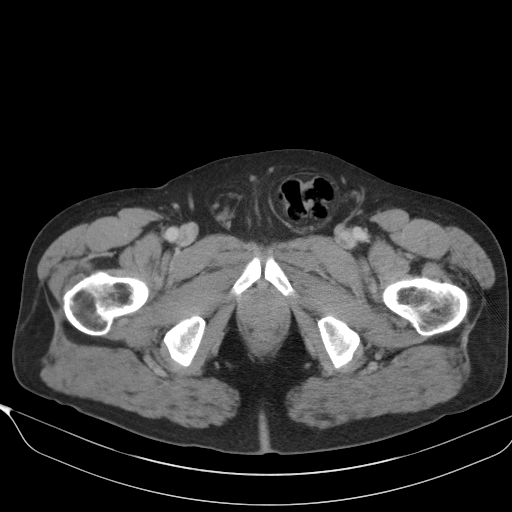
[im 34/112  soft-tissue]
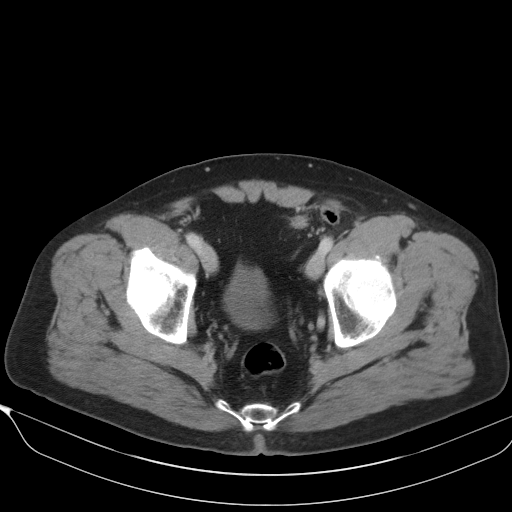
[im 39/112  soft-tissue]
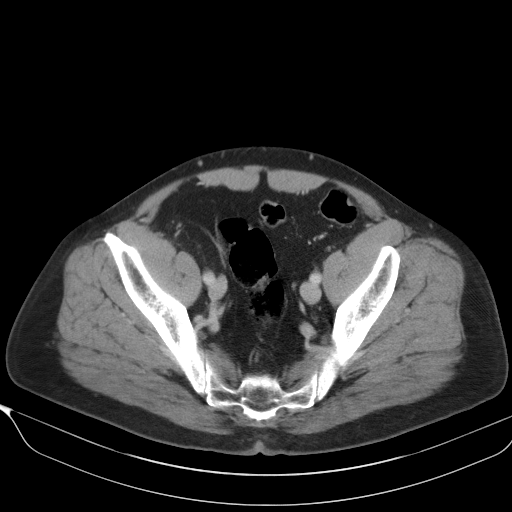
[im 50/112  soft-tissue]
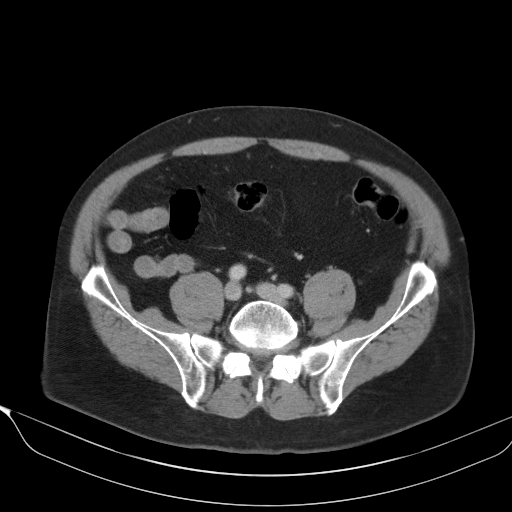
[im 56/112  soft-tissue]
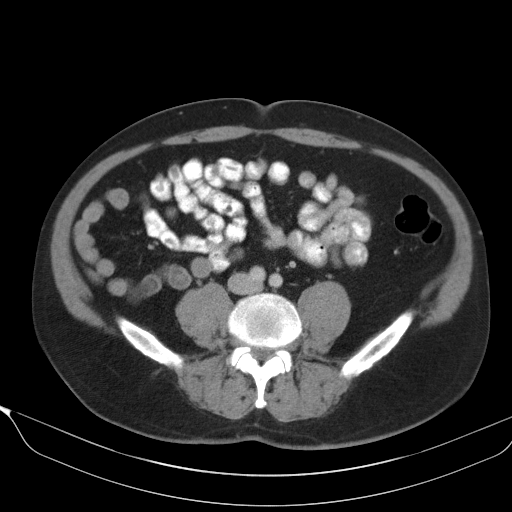
[im 62/112  soft-tissue]
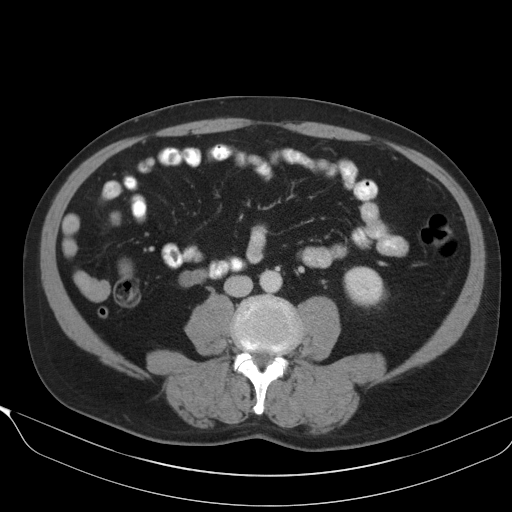
[im 73/112  soft-tissue]
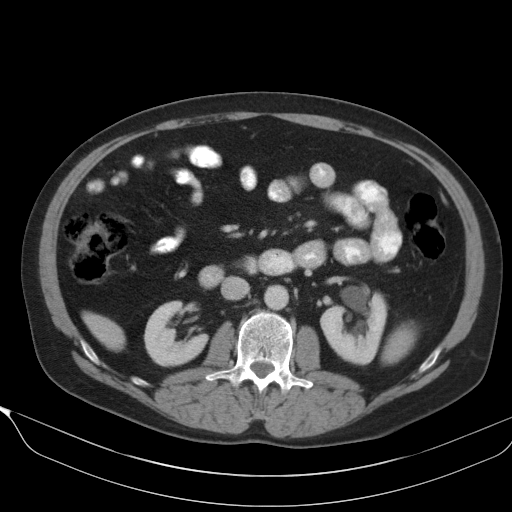
[im 73/112  bone]
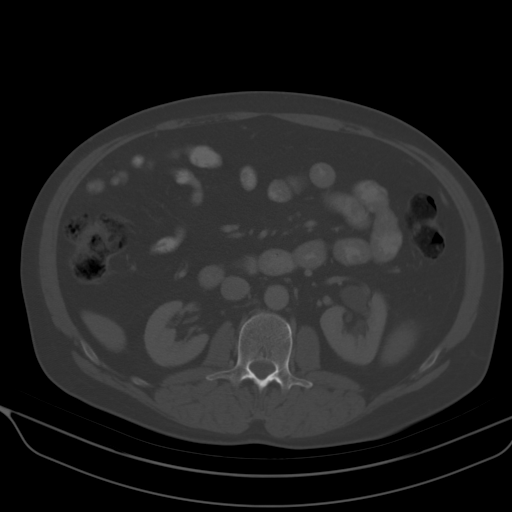
[im 78/112  soft-tissue]
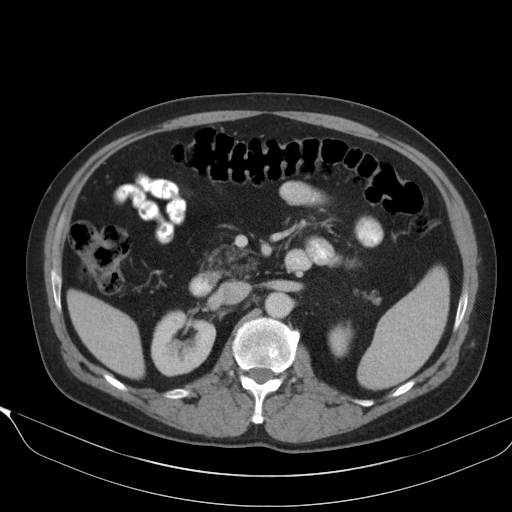
[im 89/112  soft-tissue]
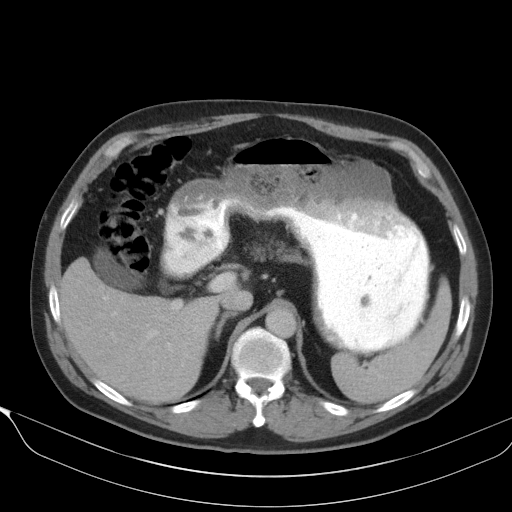
[im 89/112  lung]
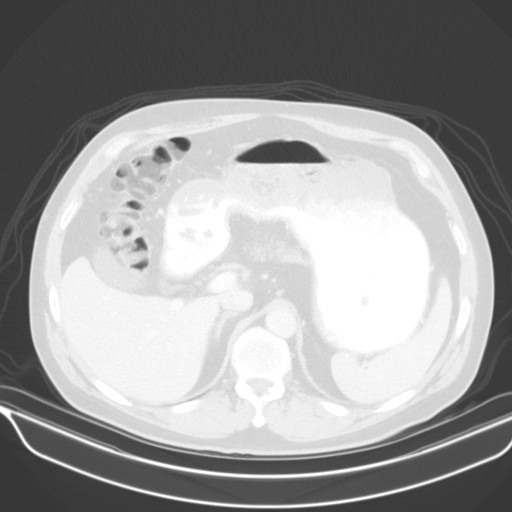
[im 95/112  soft-tissue]
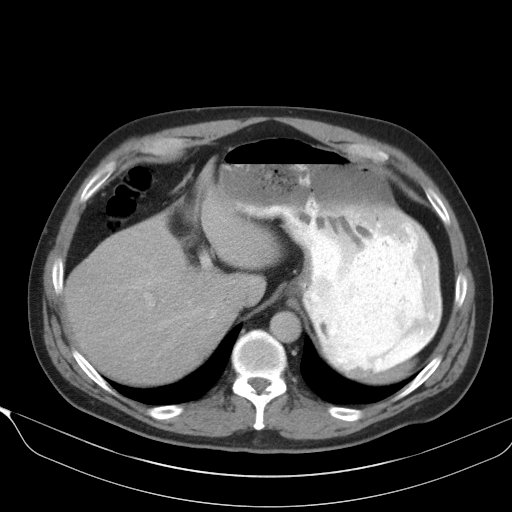
[im 95/112  lung]
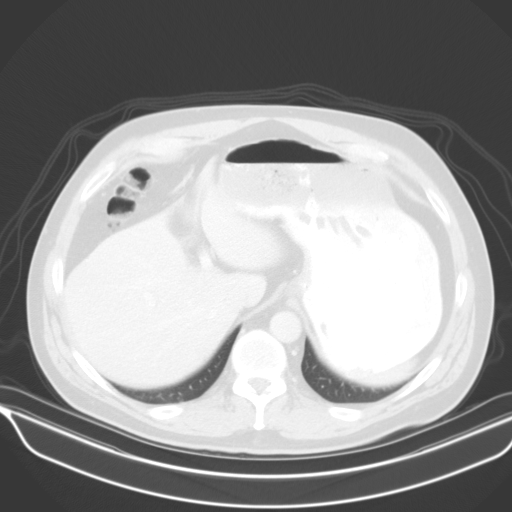
[im 100/112  lung]
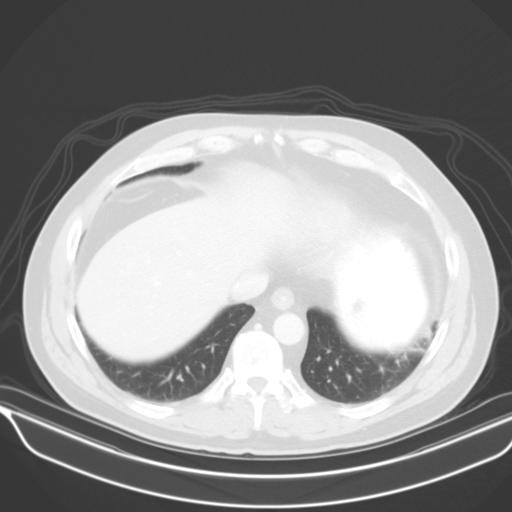
[im 106/112  soft-tissue]
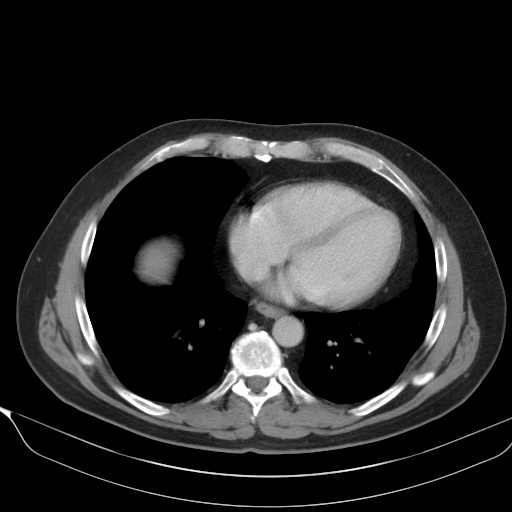
[im 106/112  lung]
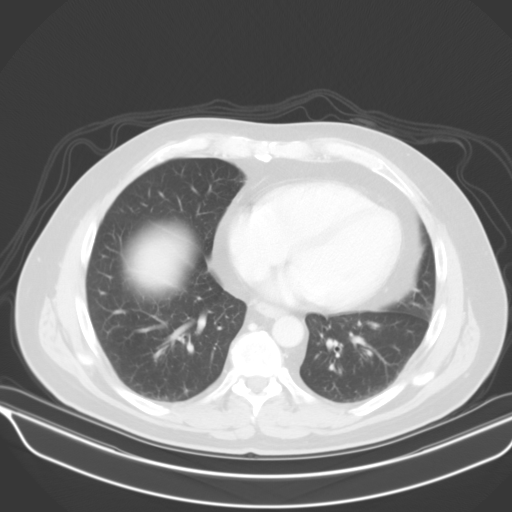

[14 of 32 positions shown; findings below may reference images not displayed]

FINDINGS: Lower chest: Mild scarring or atelectasis and the left lower lobe
laterally. No pleural or pericardial fluid.

Hepatobiliary: No abnormal liver finding.  No calcified gallstones.

Pancreas: Normal

Spleen: Normal

Adrenals/Urinary Tract: Adrenal glands are normal. Right kidney
shows an a nonobstructing 3 mm stone in the midportion. Left kidney
shows several tiny nonobstructing stones. 1 cm cyst lower pole left
kidney. No hydronephrosis.

Stomach/Bowel: Stomach is full of ingested material. No evidence of
bowel obstruction. Normal appearing appendix. No evidence of
diverticulitis. There is a left inguinal hernia containing fat and
sigmoid colon.

Vascular/Lymphatic: Aortic atherosclerosis. No aneurysm. IVC is
normal. No retroperitoneal mass or lymphadenopathy.

Reproductive: Normal

Other: No free fluid or air.

Musculoskeletal: Negative
IMPRESSION: Left inguinal hernia containing sigmoid colon. No evidence for
obstruction or incarceration.

Nonobstructing small renal calculi.

Aortic atherosclerosis.

## 2016-03-09 MED ORDER — IOPAMIDOL (ISOVUE-300) INJECTION 61%
100.0000 mL | Freq: Once | INTRAVENOUS | Status: AC | PRN
  Administered 2016-03-09: 100 mL via INTRAVENOUS

## 2016-03-27 ENCOUNTER — Other Ambulatory Visit: Payer: Self-pay | Admitting: General Surgery

## 2016-03-27 DIAGNOSIS — K4090 Unilateral inguinal hernia, without obstruction or gangrene, not specified as recurrent: Secondary | ICD-10-CM | POA: Diagnosis not present

## 2016-04-23 DIAGNOSIS — N200 Calculus of kidney: Secondary | ICD-10-CM | POA: Diagnosis not present

## 2016-04-23 DIAGNOSIS — N400 Enlarged prostate without lower urinary tract symptoms: Secondary | ICD-10-CM | POA: Diagnosis not present

## 2016-04-26 ENCOUNTER — Encounter (HOSPITAL_BASED_OUTPATIENT_CLINIC_OR_DEPARTMENT_OTHER): Payer: Self-pay | Admitting: *Deleted

## 2016-04-26 NOTE — Progress Notes (Signed)
Boost Breeze given to pt with instruction to drink by 0500, pt verbalized understanding.

## 2016-04-30 DIAGNOSIS — H6123 Impacted cerumen, bilateral: Secondary | ICD-10-CM | POA: Diagnosis not present

## 2016-05-01 NOTE — Anesthesia Preprocedure Evaluation (Signed)
Anesthesia Evaluation  Patient identified by MRN, date of birth, ID band Patient awake    Reviewed: Allergy & Precautions, NPO status , Patient's Chart, lab work & pertinent test results  History of Anesthesia Complications (+) PONV and history of anesthetic complications  Airway Mallampati: II  TM Distance: >3 FB Neck ROM: Full    Dental no notable dental hx.    Pulmonary neg pulmonary ROS,    Pulmonary exam normal breath sounds clear to auscultation       Cardiovascular negative cardio ROS Normal cardiovascular exam Rhythm:Regular Rate:Normal     Neuro/Psych negative neurological ROS  negative psych ROS   GI/Hepatic negative GI ROS, Neg liver ROS,   Endo/Other  negative endocrine ROS  Renal/GU negative Renal ROS     Musculoskeletal negative musculoskeletal ROS (+)   Abdominal   Peds  Hematology negative hematology ROS (+)   Anesthesia Other Findings   Reproductive/Obstetrics negative OB ROS                             Anesthesia Physical Anesthesia Plan  ASA: II  Anesthesia Plan: General and Regional   Post-op Pain Management: GA combined w/ Regional for post-op pain   Induction: Intravenous  Airway Management Planned: LMA  Additional Equipment:   Intra-op Plan:   Post-operative Plan: Extubation in OR  Informed Consent: I have reviewed the patients History and Physical, chart, labs and discussed the procedure including the risks, benefits and alternatives for the proposed anesthesia with the patient or authorized representative who has indicated his/her understanding and acceptance.   Dental advisory given  Plan Discussed with: CRNA  Anesthesia Plan Comments:         Anesthesia Quick Evaluation  

## 2016-05-02 ENCOUNTER — Ambulatory Visit (HOSPITAL_BASED_OUTPATIENT_CLINIC_OR_DEPARTMENT_OTHER): Payer: 59 | Admitting: Certified Registered"

## 2016-05-02 ENCOUNTER — Encounter (HOSPITAL_BASED_OUTPATIENT_CLINIC_OR_DEPARTMENT_OTHER): Payer: Self-pay | Admitting: Certified Registered"

## 2016-05-02 ENCOUNTER — Ambulatory Visit (HOSPITAL_BASED_OUTPATIENT_CLINIC_OR_DEPARTMENT_OTHER)
Admission: RE | Admit: 2016-05-02 | Discharge: 2016-05-02 | Disposition: A | Discharge status: Home / Self Care | Payer: 59 | Source: Ambulatory Visit | Attending: General Surgery | Admitting: General Surgery

## 2016-05-02 ENCOUNTER — Encounter (HOSPITAL_BASED_OUTPATIENT_CLINIC_OR_DEPARTMENT_OTHER)
Admission: RE | Disposition: A | Discharge status: Home / Self Care | Payer: Self-pay | Source: Ambulatory Visit | Attending: General Surgery

## 2016-05-02 DIAGNOSIS — Z905 Acquired absence of kidney: Secondary | ICD-10-CM | POA: Insufficient documentation

## 2016-05-02 DIAGNOSIS — K4090 Unilateral inguinal hernia, without obstruction or gangrene, not specified as recurrent: Secondary | ICD-10-CM | POA: Insufficient documentation

## 2016-05-02 DIAGNOSIS — G8918 Other acute postprocedural pain: Secondary | ICD-10-CM | POA: Diagnosis not present

## 2016-05-02 HISTORY — DX: Personal history of urinary calculi: Z87.442

## 2016-05-02 HISTORY — DX: Other complications of anesthesia, initial encounter: T88.59XA

## 2016-05-02 HISTORY — DX: Unilateral inguinal hernia, without obstruction or gangrene, not specified as recurrent: K40.90

## 2016-05-02 HISTORY — PX: INSERTION OF MESH: SHX5868

## 2016-05-02 HISTORY — DX: Other specified postprocedural states: R11.2

## 2016-05-02 HISTORY — DX: Nausea with vomiting, unspecified: Z98.890

## 2016-05-02 HISTORY — PX: INGUINAL HERNIA REPAIR: SHX194

## 2016-05-02 HISTORY — DX: Adverse effect of unspecified anesthetic, initial encounter: T41.45XA

## 2016-05-02 SURGERY — REPAIR, HERNIA, INGUINAL, ADULT
Anesthesia: Regional | Site: Inguinal | Laterality: Left

## 2016-05-02 MED ORDER — CEFAZOLIN SODIUM-DEXTROSE 2-4 GM/100ML-% IV SOLN
2.0000 g | INTRAVENOUS | Status: AC
  Administered 2016-05-02: 2 g via INTRAVENOUS

## 2016-05-02 MED ORDER — CEFAZOLIN SODIUM-DEXTROSE 2-4 GM/100ML-% IV SOLN
INTRAVENOUS | Status: AC
  Filled 2016-05-02: qty 100

## 2016-05-02 MED ORDER — HYDROMORPHONE HCL 1 MG/ML IJ SOLN
INTRAMUSCULAR | Status: AC
  Filled 2016-05-02: qty 1

## 2016-05-02 MED ORDER — MEPERIDINE HCL 25 MG/ML IJ SOLN: 6.2500 mg | INTRAMUSCULAR | Status: DC | PRN

## 2016-05-02 MED ORDER — OXYCODONE-ACETAMINOPHEN 10-325 MG PO TABS: 1.0000 | ORAL_TABLET | Freq: Four times a day (QID) | ORAL | 0 refills | Status: AC | PRN

## 2016-05-02 MED ORDER — SUGAMMADEX SODIUM 500 MG/5ML IV SOLN
INTRAVENOUS | Status: DC | PRN
  Administered 2016-05-02: 350 mg via INTRAVENOUS

## 2016-05-02 MED ORDER — PROMETHAZINE HCL 25 MG/ML IJ SOLN: 6.2500 mg | INTRAMUSCULAR | Status: DC | PRN

## 2016-05-02 MED ORDER — ONDANSETRON HCL 4 MG/2ML IJ SOLN
INTRAMUSCULAR | Status: DC | PRN
  Administered 2016-05-02: 4 mg via INTRAVENOUS

## 2016-05-02 MED ORDER — HYDROMORPHONE HCL 1 MG/ML IJ SOLN
0.2500 mg | INTRAMUSCULAR | Status: DC | PRN
  Administered 2016-05-02 (×2): 0.5 mg via INTRAVENOUS

## 2016-05-02 MED ORDER — ONDANSETRON HCL 4 MG/2ML IJ SOLN
INTRAMUSCULAR | Status: AC
  Filled 2016-05-02: qty 2

## 2016-05-02 MED ORDER — FENTANYL CITRATE (PF) 100 MCG/2ML IJ SOLN
INTRAMUSCULAR | Status: AC
  Filled 2016-05-02: qty 2

## 2016-05-02 MED ORDER — LACTATED RINGERS IV SOLN
INTRAVENOUS | Status: DC
  Administered 2016-05-02 (×2): via INTRAVENOUS

## 2016-05-02 MED ORDER — LIDOCAINE HCL (CARDIAC) 20 MG/ML IV SOLN
INTRAVENOUS | Status: DC | PRN
  Administered 2016-05-02: 30 mg via INTRAVENOUS

## 2016-05-02 MED ORDER — MIDAZOLAM HCL 2 MG/2ML IJ SOLN
INTRAMUSCULAR | Status: AC
  Filled 2016-05-02: qty 2

## 2016-05-02 MED ORDER — BUPIVACAINE HCL (PF) 0.25 % IJ SOLN
INTRAMUSCULAR | Status: DC | PRN
  Administered 2016-05-02: 5 mL

## 2016-05-02 MED ORDER — FENTANYL CITRATE (PF) 100 MCG/2ML IJ SOLN
50.0000 ug | INTRAMUSCULAR | Status: AC | PRN
  Administered 2016-05-02: 100 ug via INTRAVENOUS
  Administered 2016-05-02: 50 ug via INTRAVENOUS
  Administered 2016-05-02: 25 ug via INTRAVENOUS

## 2016-05-02 MED ORDER — ROCURONIUM BROMIDE 100 MG/10ML IV SOLN
INTRAVENOUS | Status: DC | PRN
Start: 2016-05-02 — End: 2016-05-02
  Administered 2016-05-02: 50 mg via INTRAVENOUS

## 2016-05-02 MED ORDER — ACETAMINOPHEN 500 MG PO TABS
1000.0000 mg | ORAL_TABLET | ORAL | Status: AC
  Administered 2016-05-02: 1000 mg via ORAL

## 2016-05-02 MED ORDER — GABAPENTIN 300 MG PO CAPS
ORAL_CAPSULE | ORAL | Status: AC
  Filled 2016-05-02: qty 1

## 2016-05-02 MED ORDER — PROPOFOL 10 MG/ML IV BOLUS
INTRAVENOUS | Status: AC
  Filled 2016-05-02: qty 20

## 2016-05-02 MED ORDER — MIDAZOLAM HCL 2 MG/2ML IJ SOLN
1.0000 mg | INTRAMUSCULAR | Status: DC | PRN
  Administered 2016-05-02: 2 mg via INTRAVENOUS

## 2016-05-02 MED ORDER — CELECOXIB 200 MG PO CAPS
ORAL_CAPSULE | ORAL | Status: AC
  Filled 2016-05-02: qty 1

## 2016-05-02 MED ORDER — ACETAMINOPHEN 500 MG PO TABS
ORAL_TABLET | ORAL | Status: AC
  Filled 2016-05-02: qty 2

## 2016-05-02 MED ORDER — ONDANSETRON HCL 4 MG/2ML IJ SOLN
INTRAMUSCULAR | Status: AC
  Filled 2016-05-02: qty 10

## 2016-05-02 MED ORDER — DEXAMETHASONE SODIUM PHOSPHATE 4 MG/ML IJ SOLN
INTRAMUSCULAR | Status: DC | PRN
  Administered 2016-05-02: 10 mg via INTRAVENOUS

## 2016-05-02 MED ORDER — EPHEDRINE SULFATE 50 MG/ML IJ SOLN
INTRAMUSCULAR | Status: DC | PRN
  Administered 2016-05-02 (×2): 10 mg via INTRAVENOUS

## 2016-05-02 MED ORDER — SCOPOLAMINE 1 MG/3DAYS TD PT72
1.0000 | MEDICATED_PATCH | Freq: Once | TRANSDERMAL | Status: AC | PRN
  Administered 2016-05-02: 1 via TRANSDERMAL

## 2016-05-02 MED ORDER — BUPIVACAINE HCL (PF) 0.5 % IJ SOLN
INTRAMUSCULAR | Status: AC
  Filled 2016-05-02: qty 30

## 2016-05-02 MED ORDER — PROPOFOL 10 MG/ML IV BOLUS
INTRAVENOUS | Status: DC | PRN
  Administered 2016-05-02: 150 mg via INTRAVENOUS

## 2016-05-02 MED ORDER — LIDOCAINE 2% (20 MG/ML) 5 ML SYRINGE
INTRAMUSCULAR | Status: AC
  Filled 2016-05-02: qty 5

## 2016-05-02 MED ORDER — BUPIVACAINE HCL (PF) 0.25 % IJ SOLN
INTRAMUSCULAR | Status: AC
  Filled 2016-05-02: qty 60

## 2016-05-02 MED ORDER — GABAPENTIN 300 MG PO CAPS
300.0000 mg | ORAL_CAPSULE | ORAL | Status: AC
  Administered 2016-05-02: 300 mg via ORAL

## 2016-05-02 MED ORDER — DEXAMETHASONE SODIUM PHOSPHATE 10 MG/ML IJ SOLN
INTRAMUSCULAR | Status: AC
  Filled 2016-05-02: qty 1

## 2016-05-02 MED ORDER — DEXAMETHASONE SODIUM PHOSPHATE 10 MG/ML IJ SOLN
INTRAMUSCULAR | Status: AC
  Filled 2016-05-02: qty 2

## 2016-05-02 MED ORDER — CELECOXIB 200 MG PO CAPS
200.0000 mg | ORAL_CAPSULE | ORAL | Status: AC
  Administered 2016-05-02: 200 mg via ORAL

## 2016-05-02 SURGICAL SUPPLY — 40 items
BLADE CLIPPER SURG (BLADE) ×2 IMPLANT
BLADE SURG 15 STRL LF DISP TIS (BLADE) ×1 IMPLANT
BLADE SURG 15 STRL SS (BLADE) ×1
CHLORAPREP W/TINT 26ML (MISCELLANEOUS) ×2 IMPLANT
COVER BACK TABLE 60X90IN (DRAPES) ×2 IMPLANT
COVER MAYO STAND STRL (DRAPES) ×2 IMPLANT
DECANTER SPIKE VIAL GLASS SM (MISCELLANEOUS) IMPLANT
DERMABOND ADVANCED (GAUZE/BANDAGES/DRESSINGS) ×1
DERMABOND ADVANCED .7 DNX12 (GAUZE/BANDAGES/DRESSINGS) ×1 IMPLANT
DRAIN PENROSE 1/2X12 LTX STRL (WOUND CARE) ×2 IMPLANT
DRAPE LAPAROTOMY TRNSV 102X78 (DRAPE) ×2 IMPLANT
DRAPE UTILITY XL STRL (DRAPES) ×2 IMPLANT
ELECT COATED BLADE 2.86 ST (ELECTRODE) ×2 IMPLANT
ELECT REM PT RETURN 9FT ADLT (ELECTROSURGICAL) ×2
ELECTRODE REM PT RTRN 9FT ADLT (ELECTROSURGICAL) ×1 IMPLANT
GLOVE BIO SURGEON STRL SZ 6.5 (GLOVE) ×2 IMPLANT
GLOVE BIO SURGEON STRL SZ7 (GLOVE) ×4 IMPLANT
GLOVE BIOGEL PI IND STRL 7.5 (GLOVE) ×1 IMPLANT
GLOVE BIOGEL PI INDICATOR 7.5 (GLOVE) ×1
GOWN STRL REUS W/ TWL LRG LVL3 (GOWN DISPOSABLE) ×3 IMPLANT
GOWN STRL REUS W/TWL LRG LVL3 (GOWN DISPOSABLE) ×3
MESH ULTRAPRO 3X6 7.6X15CM (Mesh General) ×2 IMPLANT
NEEDLE HYPO 22GX1.5 SAFETY (NEEDLE) ×2 IMPLANT
NS IRRIG 1000ML POUR BTL (IV SOLUTION) ×2 IMPLANT
PACK BASIN DAY SURGERY FS (CUSTOM PROCEDURE TRAY) ×2 IMPLANT
PENCIL BUTTON HOLSTER BLD 10FT (ELECTRODE) ×2 IMPLANT
SLEEVE SCD COMPRESS KNEE MED (MISCELLANEOUS) ×2 IMPLANT
SPONGE LAP 4X18 X RAY DECT (DISPOSABLE) ×2 IMPLANT
SUT MNCRL AB 4-0 PS2 18 (SUTURE) ×2 IMPLANT
SUT SILK 2 0 SH (SUTURE) IMPLANT
SUT VIC AB 0 SH 27 (SUTURE) IMPLANT
SUT VIC AB 2-0 SH 18 (SUTURE) ×4 IMPLANT
SUT VIC AB 2-0 SH 27 (SUTURE) ×1
SUT VIC AB 2-0 SH 27XBRD (SUTURE) ×1 IMPLANT
SUT VIC AB 3-0 SH 27 (SUTURE) ×1
SUT VIC AB 3-0 SH 27X BRD (SUTURE) ×1 IMPLANT
SUT VICRYL AB 3 0 TIES (SUTURE) IMPLANT
SYR CONTROL 10ML LL (SYRINGE) ×2 IMPLANT
TOWEL OR 17X24 6PK STRL BLUE (TOWEL DISPOSABLE) ×2 IMPLANT
TOWEL OR NON WOVEN STRL DISP B (DISPOSABLE) ×2 IMPLANT

## 2016-05-02 NOTE — Op Note (Signed)
Preoperative diagnosis: left inguinal hernia Postoperative diagnosis: left inguinal hernia, indirect Procedure: leftinguinal hernia repair with ultrapro mesh Surgeon: Dr Harden MoMatt Aariyana Manz Anesthesia: Gen. with a TAPblock Estimated blood loss: Minimal Drains: None Specimens: None Complications: None Disposition to recovery in stable condition  Indications: This is a 60yom with a symptomatic leftgroin hernia that is not reducible and has sigmoid colon present on ct scan. We discussed all of his operative risks and elected to proceed with inguinal hernia repair.  Procedure: After informed consent was obtained the patient was taken to the operating room. He underwent a TAPblock first. He was given antibiotics. He was placed under general anesthesia. He was prepped and draped in standard sterile surgical fashion. A timeout was performed.  I infiltrated marcaine in the skin. I made a leftgroin incision.I then identified the external oblique. I then opened the external oblique. I then noted him to have large leftindirect hernia. This was chronic and was difficult to separate the sac which tracked all the way down into the scrotum. I excised a large cord lipoma and then reducted the indirect hernia sac in total.  There was no evidence of a direct hernia .  I then placed an ultrapro mesh patch over the floor. I then sutured the mesh to the pubic tubercle with 2-0 vicryl. I made a cut and wrapped this around the spermatic cord. I secured this every half centimeter to the inguinal ligament as well. I then secured this to the internal oblique superiorly. I laid the lateral portion flat. This completely obliterated the defect. I made sure the testicle was back in the scrotum at this point and appeared to be in place.  Hemostasis wasobserved. I then closed this with 2-0 Vicryl and 3-0 Vicryl and 4-0 Monocryl. Dermabond and Steri-Strips were applied. He tolerated this well was extubated and transferred  to recovery in stable condition.

## 2016-05-02 NOTE — Discharge Instructions (Signed)
[facility] ° °UMBILICAL OR INGUINAL HERNIA REPAIR: POST OP INSTRUCTIONS ° °Always review your discharge instruction sheet given to you by the facility where your surgery was performed. °IF YOU HAVE DISABILITY OR FAMILY LEAVE FORMS, YOU MUST BRING THEM TO THE OFFICE FOR PROCESSING.   °DO NOT GIVE THEM TO YOUR DOCTOR. ° °1. A  prescription for pain medication may be given to you upon discharge.  Take your pain medication as prescribed, if needed.  If narcotic pain medicine is not needed, then you may take acetaminophen (Tylenol), naprosyn (Alleve) or ibuprofen (Advil) as needed. °2. Take your usually prescribed medications unless otherwise directed. °3. If you need a refill on your pain medication, please contact your pharmacy.  They will contact our office to request authorization. Prescriptions will not be filled after 5 pm or on week-ends. °4. You should follow a light diet the first 24 hours after arrival home, such as soup and crackers, etc.  Be sure to include lots of fluids daily.  Resume your normal diet the day after surgery. °5. Most patients will experience some swelling and bruising around the umbilicus or in the groin and scrotum.  Ice packs and reclining will help.  Swelling and bruising can take several days to resolve.  °6. It is common to experience some constipation if taking pain medication after surgery.  Increasing fluid intake and taking a stool softener (such as Colace) will usually help or prevent this problem from occurring.  A mild laxative (Milk of Magnesia or Miralax) should be taken according to package directions if there are no bowel movements after 48 hours. °7. Unless discharge instructions indicate otherwise, you may remove your bandages 48 hours after surgery, and you may shower at that time.  You may have steri-strips (small skin tapes) in place directly over the incision.  These strips should be left on the skin for 7-10 days and will come off on their own.   If your surgeon used skin glue on the incision, you may shower in 24 hours.  The glue will flake off over the next 2-3 weeks.  Any sutures or staples will be removed at the office during your follow-up visit. °8. ACTIVITIES:  You may resume regular (light) daily activities beginning the next day--such as daily self-care, walking, climbing stairs--gradually increasing activities as tolerated.  You may have sexual intercourse when it is comfortable.  Refrain from any heavy lifting or straining until approved by your doctor. °a. You may drive when you are no longer taking prescription pain medication, you can comfortably wear a seatbelt, and you can safely maneuver your car and apply brakes. °b. RETURN TO WORK:  __________________________________________________________ °9. You should see your doctor in the office for a follow-up appointment approximately 2-3 weeks after your surgery.  Make sure that you call for this appointment within a day or two after you arrive home to insure a convenient appointment time. °10. OTHER INSTRUCTIONS:  __________________________________________________________________________________________________________________________________________________________________________________________  °WHEN TO CALL YOUR DOCTOR: °1. Fever over 101.0 °2. Inability to urinate °3. Nausea and/or vomiting °4. Extreme swelling or bruising °5. Continued bleeding from incision. °6. Increased pain, redness, or drainage from the incision ° °The clinic staff is available to answer your questions during regular business hours.  Please don’t hesitate to call and ask to speak to one of the nurses for clinical concerns.  If you have a medical emergency, go to the nearest emergency room or call 911.  A surgeon from [facility]   is always on call at the hospital ° ° °[address], [address], [place], [place]  [address] ? ° [address] [address], [place], [place]   [address] °[phone] ? [phone] ? FAX  [phone] °Web site: [email or web address] ° ° °Post Anesthesia Home Care Instructions ° °Activity: °Get plenty of rest for the remainder of the day. A responsible individual must stay with you for 24 hours following the procedure.  °For the next 24 hours, DO NOT: °-Drive a car °-Operate machinery °-Drink alcoholic beverages °-Take any medication unless instructed by your physician °-Make any legal decisions or sign important papers. ° °Meals: °Start with liquid foods such as gelatin or soup. Progress to regular foods as tolerated. Avoid greasy, spicy, heavy foods. If nausea and/or vomiting occur, drink only clear liquids until the nausea and/or vomiting subsides. Call your physician if vomiting continues. ° °Special Instructions/Symptoms: °Your throat may feel dry or sore from the anesthesia or the breathing tube placed in your throat during surgery. If this causes discomfort, gargle with warm salt water. The discomfort should disappear within 24 hours. ° °If you had a scopolamine patch placed behind your ear for the management of post- operative nausea and/or vomiting: ° °1. The medication in the patch is effective for 72 hours, after which it should be removed.  Wrap patch in a tissue and discard in the trash. Wash hands thoroughly with soap and water. °2. You may remove the patch earlier than 72 hours if you experience unpleasant side effects which may include dry mouth, dizziness or visual disturbances. °3. Avoid touching the patch. Wash your hands with soap and water after contact with the patch. °  ° °

## 2016-05-02 NOTE — H&P (Signed)
  [age] yom referred by Dr Manus GunningEhinger for newly diagnosed lih. he reports one year history of llq and groin pain associated with bulge in his groin when he increases activity and lifts. he notes no change in bms. he has undergone a ct scan that shows lih with sigmoid colon present   Past Surgical History  Nephrectomy  Left. Vasectomy   Diagnostic Studies History Colonoscopy  5-10 years ago  Allergies No Known Drug Allergies   Medication History  No Current Medications Medications Reconciled  Social History Alcohol use  Occasional alcohol use. Caffeine use  Coffee.  Family History  Breast Cancer  Mother. Diabetes Mellitus  Mother. Hypertension  Mother. Respiratory Condition  Father.  Other Problems  Kidney Stone    Review of Systems  General Not Present- Appetite Loss, Chills, Fatigue, Fever, Night Sweats, Weight Gain and Weight Loss. Skin Not Present- Change in Wart/Mole, Dryness, Hives, Jaundice, New Lesions, Non-Healing Wounds, Rash and Ulcer. HEENT Present- Sinus Pain. Not Present- Earache, Hearing Loss, Hoarseness, Nose Bleed, Oral Ulcers, Ringing in the Ears, Seasonal Allergies, Sore Throat, Visual Disturbances, Wears glasses/contact lenses and Yellow Eyes. Respiratory Not Present- Bloody sputum, Chronic Cough, Difficulty Breathing, Snoring and Wheezing. Breast Not Present- Breast Mass, Breast Pain, Nipple Discharge and Skin Changes. Cardiovascular Not Present- Chest Pain, Difficulty Breathing Lying Down, Leg Cramps, Palpitations, Rapid Heart Rate, Shortness of Breath and Swelling of Extremities. Male Genitourinary Present- Frequency. Not Present- Blood in Urine, Change in Urinary Stream, Impotence, Nocturia, Painful Urination, Urgency and Urine Leakage.  Vitals  Weight: 175.25 lb Temp.: 97.23F(Oral)  Pulse: 75 (Regular)  BP: 150/90 (Sitting, Left Arm, Standard)  Physical Exam General Mental Status-Alert. Orientation-Oriented X3.  Chest and  Lung Exam Chest and lung exam reveals -on auscultation, normal breath sounds, no adventitious sounds and normal vocal resonance.  Cardiovascular Cardiovascular examination reveals -normal heart sounds, regular rate and rhythm with no murmurs.  Abdomen Note: soft nt/nd bs present Left groin hernia that is not entirely reducible, nontender no rih testicles descended bilaterally Lymphatic Head & Neck General Head & Neck Lymphatics: Bilateral - Description - Normal.  Assessment & Plan  INGUINAL HERNIA, LEFT (K40.90) Story: LIH repair with mesh We discussed observation versus repair. We discussed both laparoscopic and open inguinal hernia repairs. I described the procedure in detail. The patient was given educational material. Goals should be achieved with surgery. We discussed the usage of mesh and the rationale behind that. We went over the pathophysiology of inguinal hernia. We have elected to perform open inguinal hernia repair with mesh. We discussed the risks including bleeding, infection, recurrence, postoperative pain and chronic groin pain, testicular injury, urinary retention, numbness in groin and around incision.

## 2016-05-02 NOTE — Anesthesia Procedure Notes (Signed)
Anesthesia Regional Block: TAP block   Pre-Anesthetic Checklist: ,, timeout performed, Correct Patient, Correct Site, Correct Laterality, Correct Procedure, Correct Position, site marked, Risks and benefits discussed,  Surgical consent,  Pre-op evaluation,  At surgeon's request and post-op pain management  Laterality: Left  Prep: chloraprep       Needles:  Injection technique: Single-shot  Needle Type: Stimiplex     Needle Length: 10cm  Needle Gauge: 21     Additional Needles:   Procedures: ultrasound guided, nerve stimulator,,,,,,  Motor weakness within 5 minutes.  Narrative:  Injection made incrementally with aspirations every 5 mL.  Performed by: Personally  Anesthesiologist: Lewie LoronGERMEROTH, Adisyn Ruscitti  Additional Notes: Good facial spread. Patient tolerated well.

## 2016-05-02 NOTE — Anesthesia Procedure Notes (Signed)
Procedure Name: Intubation Date/Time: 05/02/2016 8:37 AM Performed by: Lajean Boese D Pre-anesthesia Checklist: Patient identified, Emergency Drugs available, Suction available and Patient being monitored Patient Re-evaluated:Patient Re-evaluated prior to inductionOxygen Delivery Method: Circle system utilized Preoxygenation: Pre-oxygenation with 100% oxygen Intubation Type: IV induction Ventilation: Mask ventilation without difficulty Laryngoscope Size: Mac and 3 Grade View: Grade II Tube type: Oral Tube size: 7.0 mm Number of attempts: 1 Airway Equipment and Method: Stylet and Oral airway Placement Confirmation: ETT inserted through vocal cords under direct vision,  positive ETCO2 and breath sounds checked- equal and bilateral Secured at: 21 cm Tube secured with: Tape Dental Injury: Teeth and Oropharynx as per pre-operative assessment

## 2016-05-02 NOTE — Interval H&P Note (Signed)
History and Physical Interval Note:  05/02/2016 8:06 AM  Albert Rivas  has presented today for surgery, with the diagnosis of Left Inguinal Hernia  The various methods of treatment have been discussed with the patient and family. After consideration of risks, benefits and other options for treatment, the patient has consented to  Procedure(s): LEFT INGUINAL HERNIA REPAIR WITH MESH (Left) INSERTION OF MESH (Left) as a surgical intervention .  The patient's history has been reviewed, patient examined, no change in status, stable for surgery.  I have reviewed the patient's chart and labs.  Questions were answered to the patient's satisfaction.     Tippi Mccrae

## 2016-05-02 NOTE — Progress Notes (Signed)
AssistedDr. Germeroth with left, ultrasound guided, transabdominal plane block. Side rails up, monitors on throughout procedure. See vital signs in flow sheet. Tolerated Procedure well.  

## 2016-05-02 NOTE — Transfer of Care (Signed)
Immediate Anesthesia Transfer of Care Note  Patient: Albert Rivas  Procedure(s) Performed: Procedure(s): LEFT INGUINAL HERNIA REPAIR WITH MESH (Left) INSERTION OF MESH (Left)  Patient Location: PACU  Anesthesia Type:GA combined with regional for post-op pain  Level of Consciousness: awake and patient cooperative  Airway & Oxygen Therapy: Patient Spontanous Breathing and Patient connected to face mask oxygen  Post-op Assessment: Report given to RN and Post -op Vital signs reviewed and stable  Post vital signs: Reviewed and stable  Last Vitals:  Vitals:   05/02/16 0815 05/02/16 0820  BP: 106/82   Pulse: (!) 59 61  Resp: 15 12  Temp:      Last Pain:  Vitals:   05/02/16 0715  TempSrc: Oral         Complications: No apparent anesthesia complications

## 2016-05-02 NOTE — Progress Notes (Signed)
Patient ID: Shawn RouteLarry W Rivas, male   DOB: Feb 05, 1956, 61 y.o.   MRN: [id number] Narcotics database reviewed today

## 2016-05-03 ENCOUNTER — Encounter (HOSPITAL_BASED_OUTPATIENT_CLINIC_OR_DEPARTMENT_OTHER): Payer: Self-pay | Admitting: General Surgery

## 2016-05-03 NOTE — Anesthesia Postprocedure Evaluation (Signed)
Anesthesia Post Note  Patient: Albert Rivas  Procedure(s) Performed: Procedure(s) (LRB): LEFT INGUINAL HERNIA REPAIR WITH MESH (Left) INSERTION OF MESH (Left)  Patient location during evaluation: PACU Anesthesia Type: Regional and General Level of consciousness: sedated and patient cooperative Pain management: pain level controlled Vital Signs Assessment: post-procedure vital signs reviewed and stable Respiratory status: spontaneous breathing Cardiovascular status: stable Anesthetic complications: no       Last Vitals:  Vitals:   05/02/16 1045 05/02/16 1110  BP: 134/84 139/88  Pulse: 87 94  Resp: 16 18  Temp:  36.5 C    Last Pain:  Vitals:   05/02/16 1110  TempSrc:   PainSc: 3                  Lewie LoronJohn Eyvette Cordon

## 2016-06-20 DIAGNOSIS — K648 Other hemorrhoids: Secondary | ICD-10-CM | POA: Diagnosis not present

## 2016-08-17 NOTE — Anesthesia Postprocedure Evaluation (Signed)
Anesthesia Post Note  Patient: Albert RouteLarry W Rivas  Procedure(s) Performed: Procedure(s) (LRB): LEFT INGUINAL HERNIA REPAIR WITH MESH (Left) INSERTION OF MESH (Left)     Anesthesia Post Evaluation  Last Vitals:  Vitals:   05/02/16 1045 05/02/16 1110  BP: 134/84 139/88  Pulse: 87 94  Resp: 16 18  Temp:  36.5 C    Last Pain:  Vitals:   05/02/16 1110  TempSrc:   PainSc: 3                  Lewie LoronJohn Koleson Reifsteck

## 2016-08-17 NOTE — Addendum Note (Signed)
Addendum  created 08/17/16 0935 by Nile Prisk, MD   Sign clinical note    

## 2016-11-07 DIAGNOSIS — K648 Other hemorrhoids: Secondary | ICD-10-CM | POA: Diagnosis not present

## 2016-12-11 DIAGNOSIS — K6289 Other specified diseases of anus and rectum: Secondary | ICD-10-CM | POA: Insufficient documentation

## 2017-01-18 DIAGNOSIS — K6289 Other specified diseases of anus and rectum: Secondary | ICD-10-CM | POA: Diagnosis not present

## 2017-01-18 DIAGNOSIS — K648 Other hemorrhoids: Secondary | ICD-10-CM | POA: Diagnosis not present

## 2017-01-18 NOTE — Anesthesia Preprocedure Evaluation (Addendum)
 DAY OF SURGERY PREOPERATIVE ANESTHESIA EVALUATION  Albert Rivas (MRN [id number]) DOB: October 03, 1955  PROCEDURE PLANNED He is a 61 y.o. male scheduled for a  Procedure(s) (LRB): EXAM UNDER ANESTHESIA (EUA) (N/A).  MEDICAL HX/ROS  Active Problem List: Patient Active Problem List   Diagnosis Date Noted   Anal pain 12/11/2016   Internal hemorrhoids 11/10/2016   Surgical History: Past Surgical History:  Procedure Laterality Date   HERNIA REPAIR     Social History: Social History  Substance Use Topics   Smoking status: Never Smoker   Smokeless tobacco: Never Used   Alcohol use Yes     Comment: occasionally    Vitals: Vitals:   01/18/17 0622  BP: 156/94  Pulse: 52  Temp: 98 F (36.7 C)  Resp: (!) 10  SpO2: 97%  PainSc: 1-One (mild)   ROS: Anesthesia History: Patient has history of anesthetic complications History of PONV. Respiratory:  Patient has no sleep apnea.  Patient has no asthma.   On no home oxygen Cardiovascular:  negative cardio ROS. Gastrointestinal: Patient has no GERD.  OBJECTIVE Medications: No Known Allergies  PHYSICAL EXAM Airway:  Mallampati Score: IV TM Distance (FB): 2 Oral Aperture (FB): 3 Face: overbite Neck: full ROM Dental: normal   Heart Rate: normal Heart rhythm: regular Lungs:  Breath sounds clear to auscultation  ASSESSMENT/PLAN    HPI:  Concerning airway exam: small mouth opening and slight overbite.  Severe PONV  ANESTHESIA PLAN: ASA Score: 1  Maintenance plan options discussed:MAC  Induction: IV  Post Op Plan: Outpatient PACU  INFORMED CONSENT:     Anesthesia Consent: Plan discussed with patient.  ATTENDING ATTESTATION: By signing, I attest that I have identified and re-evaluated the patient immediately before the induction of anesthesia and I am satisfied that my anesthetic plan is suitable for the patient's condition and procedure.  Any physical exam findings recorded in this note have been performed and  confirmed by me.    Electronically signed by: Levorn KATHEE Pall, MD 01/18/2017 6:55 AM     Electronically signed by: Levorn Euel Pall, MD 01/18/17 [phone]    Electronically signed by: Levorn Euel Pall, MD 01/18/17 [phone]

## 2017-04-23 DIAGNOSIS — N400 Enlarged prostate without lower urinary tract symptoms: Secondary | ICD-10-CM | POA: Diagnosis not present

## 2017-04-30 DIAGNOSIS — H6123 Impacted cerumen, bilateral: Secondary | ICD-10-CM | POA: Diagnosis not present

## 2017-04-30 DIAGNOSIS — H903 Sensorineural hearing loss, bilateral: Secondary | ICD-10-CM | POA: Diagnosis not present

## 2017-05-06 DIAGNOSIS — Z23 Encounter for immunization: Secondary | ICD-10-CM | POA: Diagnosis not present

## 2017-08-13 DIAGNOSIS — Z23 Encounter for immunization: Secondary | ICD-10-CM | POA: Diagnosis not present

## 2017-10-22 DIAGNOSIS — M76821 Posterior tibial tendinitis, right leg: Secondary | ICD-10-CM | POA: Diagnosis not present

## 2017-10-22 DIAGNOSIS — M722 Plantar fascial fibromatosis: Secondary | ICD-10-CM | POA: Diagnosis not present

## 2017-10-22 DIAGNOSIS — M7731 Calcaneal spur, right foot: Secondary | ICD-10-CM | POA: Diagnosis not present

## 2017-10-29 DIAGNOSIS — M71571 Other bursitis, not elsewhere classified, right ankle and foot: Secondary | ICD-10-CM | POA: Diagnosis not present

## 2017-10-29 DIAGNOSIS — M722 Plantar fascial fibromatosis: Secondary | ICD-10-CM | POA: Diagnosis not present

## 2018-03-10 DIAGNOSIS — M792 Neuralgia and neuritis, unspecified: Secondary | ICD-10-CM | POA: Diagnosis not present

## 2018-03-31 DIAGNOSIS — M5412 Radiculopathy, cervical region: Secondary | ICD-10-CM | POA: Diagnosis not present

## 2018-03-31 DIAGNOSIS — M7542 Impingement syndrome of left shoulder: Secondary | ICD-10-CM | POA: Diagnosis not present

## 2018-04-24 DIAGNOSIS — H6123 Impacted cerumen, bilateral: Secondary | ICD-10-CM | POA: Diagnosis not present

## 2018-04-24 DIAGNOSIS — H903 Sensorineural hearing loss, bilateral: Secondary | ICD-10-CM | POA: Diagnosis not present

## 2018-04-28 DIAGNOSIS — N400 Enlarged prostate without lower urinary tract symptoms: Secondary | ICD-10-CM | POA: Diagnosis not present

## 2019-05-01 DIAGNOSIS — H6123 Impacted cerumen, bilateral: Secondary | ICD-10-CM | POA: Insufficient documentation

## 2021-03-01 NOTE — Progress Notes (Signed)
 Procedure Note - Removal of Cerumen Impaction, Bilateral:  Cc: 62-month return visit for routine ear cleaning.   Risks/benefits and alternatives were discussed with patient who understands and agrees to proceed.  DETAILS OF PROCEDURE: The patient was positioned and the ear was examined with the microscope. Obstructing cerumen was gently removed using suction from the right ear canal. Minimal cerumen removed using curette from the left ear canal. TMs fully visualized, normal and intact, middle ears aerated. No signs of infection or cholesteatoma. Hearing normalized in right ear per patient.  The patient tolerated this well.  No complications.   Follow up: annually or as needed.    Electronically signed by: Velia Cecilio Paulino Benay, PA-C 03/01/21 [phone]

## 2021-09-28 NOTE — Progress Notes (Signed)
 Procedure Note - Removal of Cerumen Impaction, Bilateral:  Cc: 80-month return visit for routine ear cleaning.   Risks/benefits and alternatives were discussed with patient who understands and agrees to proceed.  DETAILS OF PROCEDURE: The patient was positioned and the ear was examined with the microscope. Obstructing cerumen was gently removed using suction from both ear canals, right more than left. TMs fully visualized, normal and intact, middle ears aerated. No signs of infection or cholesteatoma.   The patient tolerated this well.  No complications.   Follow up: annually or as needed.    Electronically signed by: Velia Cecilio Paulino Benay, PA-C 09/28/21 1549

## 2022-04-25 NOTE — Progress Notes (Signed)
 Procedure Note - Removal of Cerumen Impaction, Bilateral:  Cc: 21-month return visit for routine ear cleaning.   Risks/benefits and alternatives were discussed with patient who understands and agrees to proceed.  DETAILS OF PROCEDURE: The patient was positioned and the ear was examined with the microscope. Obstructing cerumen was gently removed using suction from both ear canals. Tms fully visualized, normal and intact, middle ears aerated. No signs of infection or cholesteatoma.   The patient tolerated this well.  No complications.  Follow up: 6 months or as needed.

## 2022-06-06 ENCOUNTER — Ambulatory Visit: Payer: 59 | Admitting: Podiatry

## 2022-06-06 ENCOUNTER — Encounter: Payer: Self-pay | Admitting: Podiatry

## 2022-06-06 DIAGNOSIS — D2372 Other benign neoplasm of skin of left lower limb, including hip: Secondary | ICD-10-CM | POA: Diagnosis not present

## 2022-06-06 DIAGNOSIS — M7752 Other enthesopathy of left foot: Secondary | ICD-10-CM

## 2022-06-06 NOTE — Progress Notes (Signed)
  Subjective:  Patient ID: Albert Rivas, male    DOB: [birth year],  MRN: [id number] HPI Chief Complaint  Patient presents with   Foot Pain    5th MPJ left - 2 small callused areas x couple months, gotten thicker and more uncomfortable, tried callus remover   New Patient (Initial Visit)    67 y.o. male presents with the above complaint.   ROS: Denies fever chills nausea vomit muscle aches pains calf pain back pain chest pain shortness of breath.  Past Medical History:  Diagnosis Date   Complication of anesthesia    History of kidney stones    Inguinal hernia left   PONV (postoperative nausea and vomiting)    Past Surgical History:  Procedure Laterality Date   INGUINAL HERNIA REPAIR Left 05/02/2016   Procedure: LEFT INGUINAL HERNIA REPAIR WITH MESH;  Surgeon: Emelia Loron, MD;  Location: [facility];  Service: General;  Laterality: Left;   INSERTION OF MESH Left 05/02/2016   Procedure: INSERTION OF MESH;  Surgeon: Emelia Loron, MD;  Location: [facility];  Service: General;  Laterality: Left;   VASECTOMY      Current Outpatient Medications:    magnesium oxide (MAG-OX) 400 MG tablet, Take 400 mg by mouth daily., Disp: , Rfl:   No Known Allergies Review of Systems Objective:  There were no vitals filed for this visit.  General: Well developed, nourished, in no acute distress, alert and oriented x3   Dermatological: Skin is warm, dry and supple bilateral. Nails x 10 are well maintained; remaining integument appears unremarkable at this time. There are no open sores, no preulcerative lesions, no rash or signs of infection present.  Porokeratotic lesions are benign skin lesion subfifth metatarsal head of the left foot.  No bursitis noted.  Vascular: Dorsalis Pedis artery and Posterior Tibial artery pedal pulses are 2/4 bilateral with immedate capillary fill time. Pedal hair growth present. No varicosities and no lower extremity edema present  bilateral.   Neruologic: Grossly intact via light touch bilateral. Vibratory intact via tuning fork bilateral. Protective threshold with Semmes Wienstein monofilament intact to all pedal sites bilateral. Patellar and Achilles deep tendon reflexes 2+ bilateral. No Babinski or clonus noted bilateral.   Musculoskeletal: No gross boney pedal deformities bilateral. No pain, crepitus, or limitation noted with foot and ankle range of motion bilateral. Muscular strength 5/5 in all groups tested bilateral.  Gait: Unassisted, Nonantalgic.    Radiographs:  None taken  Assessment & Plan:   Assessment: Benign skin lesion subfifth metatarsal head left foot.  Porokeratosis.  Plan: Mechanical debridement of poor keratoma follow-up with me as needed     Rhodes Calvert T. [place], [place]

## 2022-12-20 NOTE — Progress Notes (Signed)
 Procedure Note - Removal of Cerumen Impaction, Bilateral:  Cc: 37-month return visit for routine ear cleaning.   Risks/benefits and alternatives were discussed with patient who understands and agrees to proceed.  DETAILS OF PROCEDURE: The patient was positioned and the ear was examined with the microscope. Obstructing cerumen was gently removed using suction from both ear canals, right more than left. Tms fully visualized, normal and intact, middle ears aerated. No signs of infection or cholesteatoma.   The patient tolerated this well.  No complications.  Follow up: 6 months or as needed with ENT.

## 2023-07-15 ENCOUNTER — Other Ambulatory Visit: Payer: Self-pay | Admitting: Family Medicine

## 2023-07-15 DIAGNOSIS — M25561 Pain in right knee: Secondary | ICD-10-CM

## 2023-08-08 DIAGNOSIS — Z0000 Encounter for general adult medical examination without abnormal findings: Secondary | ICD-10-CM | POA: Diagnosis not present

## 2023-08-08 DIAGNOSIS — Z131 Encounter for screening for diabetes mellitus: Secondary | ICD-10-CM | POA: Diagnosis not present

## 2023-08-08 DIAGNOSIS — E782 Mixed hyperlipidemia: Secondary | ICD-10-CM | POA: Diagnosis not present

## 2023-08-08 DIAGNOSIS — Z23 Encounter for immunization: Secondary | ICD-10-CM | POA: Diagnosis not present

## 2023-08-08 DIAGNOSIS — Z136 Encounter for screening for cardiovascular disorders: Secondary | ICD-10-CM | POA: Diagnosis not present

## 2023-08-30 DIAGNOSIS — T148XXA Other injury of unspecified body region, initial encounter: Secondary | ICD-10-CM | POA: Diagnosis not present

## 2023-10-29 DIAGNOSIS — H6123 Impacted cerumen, bilateral: Secondary | ICD-10-CM | POA: Diagnosis not present

## 2023-10-29 NOTE — Progress Notes (Signed)
 Procedure Note - Removal of Cerumen Impaction, Bilateral:  Cc: 43-month return visit for routine ear cleaning.   Risks/benefits and alternatives were discussed with patient who understands and agrees to proceed.  DETAILS OF PROCEDURE: The patient was positioned and the ear was examined with the microscope. Obstructing cerumen was gently removed using suction from both ear canals. Tms fully visualized, normal and intact, middle ears aerated. No signs of infection or cholesteatoma. Hearing normalized in both ears per patient.   The patient tolerated this well.  No complications.  Follow up: 5 months.

## 2023-12-04 DIAGNOSIS — Z08 Encounter for follow-up examination after completed treatment for malignant neoplasm: Secondary | ICD-10-CM | POA: Diagnosis not present

## 2023-12-04 DIAGNOSIS — Z85828 Personal history of other malignant neoplasm of skin: Secondary | ICD-10-CM | POA: Diagnosis not present

## 2023-12-04 DIAGNOSIS — L821 Other seborrheic keratosis: Secondary | ICD-10-CM | POA: Diagnosis not present

## 2023-12-04 DIAGNOSIS — L570 Actinic keratosis: Secondary | ICD-10-CM | POA: Diagnosis not present

## 2023-12-04 DIAGNOSIS — L814 Other melanin hyperpigmentation: Secondary | ICD-10-CM | POA: Diagnosis not present

## 2023-12-04 DIAGNOSIS — D225 Melanocytic nevi of trunk: Secondary | ICD-10-CM | POA: Diagnosis not present

## 2024-01-20 NOTE — Progress Notes (Signed)
 Procedure Note - Removal of Cerumen Impaction, Bilateral:  Cc: 35-month return visit for routine ear cleaning. Patient prefers curette over the suction technique.  Risks/benefits and alternatives were discussed with patient who understands and agrees to proceed.  DETAILS OF PROCEDURE: The patient was positioned and the ear was examined with the microscope. Obstructing cerumen was gently removed using curette from both ear canals, R>L. Tms fully visualized, normal and intact, middle ears aerated. No signs of infection or cholesteatoma.  The patient tolerated this well.  No complications.  Follow up: 5 months.

## 2024-01-21 ENCOUNTER — Encounter (HOSPITAL_BASED_OUTPATIENT_CLINIC_OR_DEPARTMENT_OTHER): Payer: Self-pay | Admitting: Orthopaedic Surgery

## 2024-01-21 ENCOUNTER — Other Ambulatory Visit: Payer: Self-pay

## 2024-01-22 NOTE — H&P (Signed)
 PREOPERATIVE H&P  Chief Complaint: medial meniscus of right knee  HPI: Albert Rivas is a 68 y.o. male who is scheduled for, Procedures: ARTHROSCOPY, KNEE, WITH MEDIAL MENISCECTOMY.   Patient has a past medical history significant for PONV.   Patient is a 68 year old male who comes to see us  to follow-up of an MRI of his right knee. He was referral from Dr. Lajuana Chief complaint is clicking, catching and swelling of the right knee. Patient has tried and failed nonsurgical treatment such as injections and physical therapy.  Symptoms are rated as moderate to severe, and have been worsening.  This is significantly impairing activities of daily living.    Please see clinic note for further details on this patient's care.    He has elected for surgical management.   Past Medical History:  Diagnosis Date   Complication of anesthesia    History of kidney stones    Hyperlipidemia    Inguinal hernia left   PONV (postoperative nausea and vomiting)    Past Surgical History:  Procedure Laterality Date   INGUINAL HERNIA REPAIR Left 05/02/2016   Procedure: LEFT INGUINAL HERNIA REPAIR WITH MESH;  Surgeon: Donnice Bury, MD;  Location: [facility];  Service: General;  Laterality: Left;   INSERTION OF MESH Left 05/02/2016   Procedure: INSERTION OF MESH;  Surgeon: Donnice Bury, MD;  Location:  [facility];  Service: General;  Laterality: Left;   KIDNEY STONE SURGERY     VASECTOMY     Social History   Socioeconomic History   Marital status: Divorced    Spouse name: Not on file   Number of children: Not on file   Years of education: Not on file   Highest education level: Not on file  Occupational History   Not on file  Tobacco Use   Smoking status: Never   Smokeless tobacco: Never  Substance and Sexual Activity   Alcohol use: Yes    Comment: occais   Drug use: No   Sexual activity: Yes    Birth control/protection: Surgical    Comment:  vasectomy  Other Topics Concern   Not on file  Social History Narrative   Not on file   Social Drivers of Health   Tobacco Use: Low Risk (01/21/2024)   Patient History    Smoking Tobacco Use: Never    Smokeless Tobacco Use: Never    Passive Exposure: Not on file  Financial Resource Strain: Not on file  Food Insecurity: Low Risk (05/16/2023)   Received from [facility]   Epic    Within the past 12 months, you worried that your food would run out before you got money to buy more: Never true    Within the past 12 months, the food you bought just didn't last and you didn't have money to get more. : Never true  Transportation Needs: No Transportation Needs (05/16/2023)   Received from [facility]    In the past 12 months, has lack of reliable transportation kept you from medical appointments, meetings, work or from getting things needed for daily living? : No  Physical Activity: Not on file  Stress: Not on file  Social Connections: Not on file  Depression (EYV7-0): Not on file  Alcohol Screen: Not on file  Housing: Low Risk (05/16/2023)   Received from [facility]   Epic    What is your living situation today?: I have a steady place to live  Think about the place you live. Do you have problems with any of the following? Choose all that apply:: None/None on this list  Utilities: Low Risk (05/16/2023)   Received from [facility]   Utilities    In the past 12 months has the electric, gas, oil, or water company threatened to shut off services in your home? : No  Health Literacy: Not on file   History reviewed. No pertinent family history. Allergies[1] Prior to Admission medications  Medication Sig Start Date End Date Taking? Authorizing Provider  cetirizine (ZYRTEC) 10 MG tablet Take 10 mg by mouth daily.   Yes [provider]  lovastatin (MEVACOR) 10 MG tablet Take 10 mg by mouth at bedtime.   Yes [provider]  magnesium oxide (MAG-OX)  400 MG tablet Take 400 mg by mouth daily.   Yes [provider]    ROS: All other systems have been reviewed and were otherwise negative with the exception of those mentioned in the HPI and as above.  Physical Exam: General: Alert, no acute distress Cardiovascular: No pedal edema Respiratory: No cyanosis, no use of accessory musculature GI: No organomegaly, abdomen is soft and non-tender Skin: No lesions in the area of chief complaint Neurologic: Sensation intact distally Psychiatric: Patient is competent for consent with normal mood and affect Lymphatic: No axillary or cervical lymphadenopathy  MUSCULOSKELETAL:  Examination of the right knee demonstrates range of motion from 0 degrees of extension to 130 degrees of flexion with pain at the end range. He is point tender to the medial joint line.  Imaging: MRI on canopy demonstrates a complex tear of the medial meniscus.  Assessment: medial meniscus of right knee  Plan: Plan for Procedures: ARTHROSCOPY, KNEE, WITH MEDIAL MENISCECTOMY  The risks benefits and alternatives were discussed with the patient including but not limited to the risks of nonoperative treatment, versus surgical intervention including infection, bleeding, nerve injury,  blood clots, cardiopulmonary complications, morbidity, mortality, among others, and they were willing to proceed.   The patient acknowledged the explanation, agreed to proceed with the plan and consent was signed.   Operative Plan: Right knee scope with medial meniscectomy Discharge Medications: standard DVT Prophylaxis: aspirin  Physical Therapy: outpatient Special Discharge needs: +/-   Aleck LOISE Stalling, PA-C  01/22/2024 12:59 PM     [1] No Known Allergies

## 2024-01-22 NOTE — Discharge Instructions (Signed)
 Bonner Hair MD, MPH Aleck Stalling, PA-C [facility] [address] [phone] (tel)   [phone] (fax)   POST-OPERATIVE INSTRUCTIONS - Knee Arthroscopy  WOUND CARE - You may remove the Operative Dressing on Post-Op Day #3 (72hrs after surgery).   -  Alternatively if you would like you can leave dressing on until follow-up if within 7-8 days but keep it dry. - Leave steri-strips in place until they fall off on their own, usually 2 weeks postop. - An ACE wrap may be used to control swelling, do not wrap this too tight.  If the initial ACE wrap feels too tight you may loosen it. - There may be a small amount of fluid/bleeding leaking at the surgical site.  - This is normal; the knee is filled with fluid during the procedure and can leak for 24-48hrs after surgery. You may change/reinforce the bandage as needed.  - Use the Cryocuff or Ice as often as possible for the first 7 days, then as needed for pain relief. Always keep a towel, ACE wrap or other barrier between the cooling unit and your skin.  - You may shower on Post-Op Day #3. Gently pat the area dry.  - Do not soak the knee in water or submerge it.  - Do not go swimming in the pool or ocean until 4 weeks after surgery or when otherwise instructed.  Keep dry incisions as dry as possible.   BRACE/AMBULATION  -            You will not need a brace after this procedure.   - You may use crutches initially to help you weight bear, but this is not required - You can put full weight on your operative leg as you feel comfortable  PHYSICAL THERAPY - You will begin physical therapy soon after surgery (unless otherwise specified) - Please call to set up an appointment, if you do not already have one  - Let our office if there are any issues with scheduling your therapy    REGIONAL ANESTHESIA (NERVE BLOCKS) The anesthesia team may have performed a nerve block for you this is a great tool used to  minimize pain.   The block may start wearing off overnight (between 8-24 hours postop) When the block wears off, your pain may go from nearly zero to the pain you would have had postop without the block. This is an abrupt transition but nothing dangerous is happening.   This can be a challenging period but utilize your as needed pain medications to try and manage this period. We suggest you use the pain medication the first night prior to going to bed, to ease this transition.  You may take an extra dose of narcotic when this happens if needed   POST-OP MEDICATIONS- Multimodal approach to pain control In general your pain will be controlled with a combination of substances.  Prescriptions unless otherwise discussed are electronically sent to your pharmacy.  This is a carefully made plan we use to minimize narcotic use.     Meloxicam  - Anti-inflammatory medication taken on a scheduled basis Acetaminophen  - Non-narcotic pain medicine taken on a scheduled basis  Tramadol  - This is a strong narcotic, to be used only on an "as needed" basis for SEVERE pain. Aspirin  81mg  - This medicine is used to minimize the risk of blood clots after surgery. Zofran  - take as needed for nausea   FOLLOW-UP   Please call the office to schedule  a follow-up appointment for your incision check, 7-10 days post-operatively.   IF YOU HAVE ANY QUESTIONS, PLEASE FEEL FREE TO CALL OUR OFFICE.   HELPFUL INFORMATION   Keep your leg elevated to decrease swelling, which will then in turn decrease your pain. I would elevate the foot of your bed by putting a couple of couch pillows between your mattress and box spring. I would not keep pillow directly under your ankle.  - Do not sleep with a pillow behind your knee even if it is more comfortable as this may make it harder to get your knee fully straight long term.   There will be MORE swelling on days 1-3 than there is on the day of surgery.  This also is normal. The  swelling will decrease with the anti-inflammatory medication, ice and keeping it elevated. The swelling will make it more difficult to bend your knee. As the swelling goes down your motion will become easier   You may develop swelling and bruising that extends from your knee down to your calf and perhaps even to your foot over the next week. Do not be alarmed. This too is normal, and it is due to gravity   There may be some numbness adjacent to the incision site. This may last for 6-12 months or longer in some patients and is expected.   You may return to sedentary work/school in the next couple of days when you feel up to it. You will need to keep your leg elevated as much as possible    You should wean off your narcotic medicines as soon as you are able.  Most patients will be off narcotics before their first postop appointment.    We suggest you use the pain medication the first night prior to going to bed, in order to ease any pain when the anesthesia wears off. You should avoid taking pain medications on an empty stomach as it will make you nauseous.   Do not drink alcoholic beverages or take illicit drugs when taking pain medications.   It is against the law to drive while taking narcotics. You cannot drive if your Right leg is in brace locked in extension.   Pain medication may make you constipated.  Below are a few solutions to try in this order:  o Decrease the amount of pain medication if you aren't having pain.  o Drink lots of decaffeinated fluids.  o Drink prune juice and/or eat dried prunes   o If the first 3 don't work start with additional solutions  o Take Colace - an over-the-counter stool softener  o Take Senokot - an over-the-counter laxative  o Take Miralax - a stronger over-the-counter laxative    For more information including helpful videos and documents visit our website:   [email or web address]    No Tylenol  before  12:30pm No ibuprofen  4:00pm   Post Anesthesia Home Care Instructions  Activity: Get plenty of rest for the remainder of the day. A responsible individual must stay with you for 24 hours following the procedure.  For the next 24 hours, DO NOT: -Drive a car -Advertising copywriter -Drink alcoholic beverages -Take any medication unless instructed by your physician -Make any legal decisions or sign important papers.  Meals: Start with liquid foods such as gelatin or soup. Progress to regular foods as tolerated. Avoid greasy, spicy, heavy foods. If nausea and/or vomiting occur, drink only clear liquids until the nausea and/or vomiting subsides. Call your physician if vomiting continues.  Special  Instructions/Symptoms: Your throat may feel dry or sore from the anesthesia or the breathing tube placed in your throat during surgery. If this causes discomfort, gargle with warm salt water. The discomfort should disappear within 24 hours.

## 2024-01-23 ENCOUNTER — Encounter (HOSPITAL_BASED_OUTPATIENT_CLINIC_OR_DEPARTMENT_OTHER)
Admission: RE | Disposition: A | Discharge status: Home / Self Care | Payer: Self-pay | Source: Home / Self Care | Attending: Orthopaedic Surgery

## 2024-01-23 ENCOUNTER — Ambulatory Visit (HOSPITAL_BASED_OUTPATIENT_CLINIC_OR_DEPARTMENT_OTHER)
Admission: RE | Admit: 2024-01-23 | Discharge: 2024-01-23 | Disposition: A | Discharge status: Home / Self Care | Attending: Orthopaedic Surgery | Admitting: Orthopaedic Surgery

## 2024-01-23 ENCOUNTER — Ambulatory Visit (HOSPITAL_BASED_OUTPATIENT_CLINIC_OR_DEPARTMENT_OTHER): Admitting: Anesthesiology

## 2024-01-23 ENCOUNTER — Encounter (HOSPITAL_BASED_OUTPATIENT_CLINIC_OR_DEPARTMENT_OTHER): Payer: Self-pay | Admitting: Orthopaedic Surgery

## 2024-01-23 DIAGNOSIS — S83241A Other tear of medial meniscus, current injury, right knee, initial encounter: Secondary | ICD-10-CM

## 2024-01-23 DIAGNOSIS — M948X6 Other specified disorders of cartilage, lower leg: Secondary | ICD-10-CM | POA: Insufficient documentation

## 2024-01-23 DIAGNOSIS — Z79899 Other long term (current) drug therapy: Secondary | ICD-10-CM | POA: Diagnosis not present

## 2024-01-23 DIAGNOSIS — S83231A Complex tear of medial meniscus, current injury, right knee, initial encounter: Secondary | ICD-10-CM | POA: Diagnosis present

## 2024-01-23 DIAGNOSIS — X58XXXA Exposure to other specified factors, initial encounter: Secondary | ICD-10-CM | POA: Insufficient documentation

## 2024-01-23 DIAGNOSIS — Z01818 Encounter for other preprocedural examination: Secondary | ICD-10-CM

## 2024-01-23 HISTORY — PX: KNEE ARTHROSCOPY WITH MEDIAL MENISECTOMY: SHX5651

## 2024-01-23 SURGERY — ARTHROSCOPY, KNEE, WITH MEDIAL MENISCECTOMY
Anesthesia: General | Site: Knee | Laterality: Right

## 2024-01-23 MED ORDER — SCOPOLAMINE 1 MG/3DAYS TD PT72
MEDICATED_PATCH | TRANSDERMAL | Status: AC
  Filled 2024-01-23: qty 1

## 2024-01-23 MED ORDER — FENTANYL CITRATE (PF) 100 MCG/2ML IJ SOLN: 25.0000 ug | INTRAMUSCULAR | Status: DC | PRN

## 2024-01-23 MED ORDER — FENTANYL CITRATE (PF) 100 MCG/2ML IJ SOLN
INTRAMUSCULAR | Status: AC
  Filled 2024-01-23: qty 2

## 2024-01-23 MED ORDER — ACETAMINOPHEN 500 MG PO TABS
1000.0000 mg | ORAL_TABLET | Freq: Once | ORAL | Status: AC
  Administered 2024-01-23: 11:00:00 1000 mg via ORAL

## 2024-01-23 MED ORDER — SUCCINYLCHOLINE CHLORIDE 200 MG/10ML IV SOSY
PREFILLED_SYRINGE | INTRAVENOUS | Status: AC
  Filled 2024-01-23: qty 10

## 2024-01-23 MED ORDER — SCOPOLAMINE 1 MG/3DAYS TD PT72
1.0000 | MEDICATED_PATCH | TRANSDERMAL | Status: DC
  Administered 2024-01-23: 12:00:00 1 mg via TRANSDERMAL

## 2024-01-23 MED ORDER — GABAPENTIN 300 MG PO CAPS
ORAL_CAPSULE | ORAL | Status: AC
  Filled 2024-01-23: qty 1

## 2024-01-23 MED ORDER — AMISULPRIDE (ANTIEMETIC) 5 MG/2ML IV SOLN: 10.0000 mg | Freq: Once | INTRAVENOUS | Status: DC | PRN

## 2024-01-23 MED ORDER — EPHEDRINE SULFATE-NACL 50-0.9 MG/10ML-% IV SOSY
PREFILLED_SYRINGE | INTRAVENOUS | Status: DC | PRN
  Administered 2024-01-23: 13:00:00 15 mg via INTRAVENOUS

## 2024-01-23 MED ORDER — FENTANYL CITRATE (PF) 100 MCG/2ML IJ SOLN
INTRAMUSCULAR | Status: DC | PRN
  Administered 2024-01-23 (×2): 50 ug via INTRAVENOUS

## 2024-01-23 MED ORDER — OXYCODONE HCL 5 MG/5ML PO SOLN: 5.0000 mg | Freq: Once | ORAL | Status: AC | PRN

## 2024-01-23 MED ORDER — ONDANSETRON HCL 4 MG/2ML IJ SOLN
INTRAMUSCULAR | Status: DC | PRN
  Administered 2024-01-23: 14:00:00 4 mg via INTRAVENOUS

## 2024-01-23 MED ORDER — ONDANSETRON HCL 4 MG/2ML IJ SOLN
INTRAMUSCULAR | Status: AC
  Filled 2024-01-23: qty 2

## 2024-01-23 MED ORDER — ACETAMINOPHEN 500 MG PO TABS
ORAL_TABLET | ORAL | Status: AC
  Filled 2024-01-23: qty 2

## 2024-01-23 MED ORDER — EPHEDRINE 5 MG/ML INJ
INTRAVENOUS | Status: AC
  Filled 2024-01-23: qty 5

## 2024-01-23 MED ORDER — LIDOCAINE 2% (20 MG/ML) 5 ML SYRINGE
INTRAMUSCULAR | Status: AC
  Filled 2024-01-23: qty 5

## 2024-01-23 MED ORDER — TRAMADOL HCL 50 MG PO TABS: 50.0000 mg | ORAL_TABLET | Freq: Four times a day (QID) | ORAL | 0 refills | Status: AC | PRN

## 2024-01-23 MED ORDER — LIDOCAINE 2% (20 MG/ML) 5 ML SYRINGE
INTRAMUSCULAR | Status: DC | PRN
  Administered 2024-01-23: 13:00:00 60 mg via INTRAVENOUS

## 2024-01-23 MED ORDER — DEXMEDETOMIDINE HCL IN NACL 80 MCG/20ML IV SOLN
INTRAVENOUS | Status: AC
  Filled 2024-01-23: qty 20

## 2024-01-23 MED ORDER — CELECOXIB 100 MG PO CAPS: 100.0000 mg | ORAL_CAPSULE | Freq: Two times a day (BID) | ORAL | 0 refills | Status: AC

## 2024-01-23 MED ORDER — DEXAMETHASONE SOD PHOSPHATE PF 10 MG/ML IJ SOLN
INTRAMUSCULAR | Status: DC | PRN
  Administered 2024-01-23: 13:00:00 10 mg via INTRAVENOUS

## 2024-01-23 MED ORDER — ASPIRIN 81 MG PO CHEW: 81.0000 mg | CHEWABLE_TABLET | Freq: Two times a day (BID) | ORAL | 0 refills | Status: AC

## 2024-01-23 MED ORDER — MIDAZOLAM HCL 2 MG/2ML IJ SOLN
INTRAMUSCULAR | Status: AC
  Filled 2024-01-23: qty 2

## 2024-01-23 MED ORDER — MIDAZOLAM HCL (PF) 2 MG/2ML IJ SOLN
INTRAMUSCULAR | Status: DC | PRN
  Administered 2024-01-23 (×2): 1 mg via INTRAVENOUS

## 2024-01-23 MED ORDER — CEFAZOLIN SODIUM-DEXTROSE 2-4 GM/100ML-% IV SOLN
2.0000 g | INTRAVENOUS | Status: AC
  Administered 2024-01-23: 13:00:00 2 g via INTRAVENOUS

## 2024-01-23 MED ORDER — OXYCODONE HCL 5 MG PO TABS
5.0000 mg | ORAL_TABLET | Freq: Once | ORAL | Status: AC | PRN
  Administered 2024-01-23: 15:00:00 5 mg via ORAL

## 2024-01-23 MED ORDER — ONDANSETRON HCL 4 MG PO TABS: 4.0000 mg | ORAL_TABLET | Freq: Three times a day (TID) | ORAL | 0 refills | Status: AC | PRN

## 2024-01-23 MED ORDER — GABAPENTIN 300 MG PO CAPS
300.0000 mg | ORAL_CAPSULE | Freq: Once | ORAL | Status: AC
  Administered 2024-01-23: 11:00:00 300 mg via ORAL

## 2024-01-23 MED ORDER — CEFAZOLIN SODIUM-DEXTROSE 2-4 GM/100ML-% IV SOLN
INTRAVENOUS | Status: AC
  Filled 2024-01-23: qty 100

## 2024-01-23 MED ORDER — BUPIVACAINE HCL (PF) 0.25 % IJ SOLN
INTRAMUSCULAR | Status: DC | PRN
  Administered 2024-01-23: 14:00:00 20 mL

## 2024-01-23 MED ORDER — PROPOFOL 10 MG/ML IV BOLUS
INTRAVENOUS | Status: DC | PRN
  Administered 2024-01-23: 13:00:00 150 mg via INTRAVENOUS
  Administered 2024-01-23: 13:00:00 150 ug/kg/min via INTRAVENOUS

## 2024-01-23 MED ORDER — ACETAMINOPHEN 500 MG PO TABS: 1000.0000 mg | ORAL_TABLET | Freq: Three times a day (TID) | ORAL | 0 refills | Status: AC

## 2024-01-23 MED ORDER — SODIUM CHLORIDE 0.9 % IR SOLN
Status: DC | PRN
  Administered 2024-01-23: 14:00:00 3000 mL

## 2024-01-23 MED ORDER — LACTATED RINGERS IV SOLN: INTRAVENOUS | Status: DC

## 2024-01-23 MED FILL — Oxycodone HCl Tab 5 MG: ORAL | Qty: 1 | Status: AC

## 2024-01-23 SURGICAL SUPPLY — 26 items
BNDG ELASTIC 6INX 5YD STR LF (GAUZE/BANDAGES/DRESSINGS) ×1 IMPLANT
CHLORAPREP W/TINT 26 (MISCELLANEOUS) ×1 IMPLANT
CLSR STERI-STRIP ANTIMIC 1/2X4 (GAUZE/BANDAGES/DRESSINGS) ×1 IMPLANT
CUFF TRNQT CYL 34X4.125X (TOURNIQUET CUFF) ×1 IMPLANT
DISSECTOR 4.0MMX13CM CVD (MISCELLANEOUS) ×1 IMPLANT
DRAPE U-SHAPE 47X51 STRL (DRAPES) ×1 IMPLANT
DRAPE-T ARTHROSCOPY W/POUCH (DRAPES) ×1 IMPLANT
GAUZE SPONGE 4X4 12PLY STRL (GAUZE/BANDAGES/DRESSINGS) ×1 IMPLANT
GLOVE BIO SURGEON STRL SZ 6.5 (GLOVE) ×1 IMPLANT
GLOVE BIOGEL PI IND STRL 6.5 (GLOVE) ×1 IMPLANT
GLOVE BIOGEL PI IND STRL 8 (GLOVE) ×1 IMPLANT
GLOVE ECLIPSE 8.0 STRL XLNG CF (GLOVE) ×2 IMPLANT
GOWN STRL REUS W/ TWL LRG LVL3 (GOWN DISPOSABLE) ×1 IMPLANT
GOWN STRL REUS W/TWL XL LVL3 (GOWN DISPOSABLE) ×1 IMPLANT
KIT TURNOVER KIT B (KITS) ×1 IMPLANT
MANIFOLD NEPTUNE II (INSTRUMENTS) IMPLANT
PACK ARTHROSCOPY DSU (CUSTOM PROCEDURE TRAY) ×1 IMPLANT
SLEEVE SCD COMPRESS KNEE MED (STOCKING) ×1 IMPLANT
SOLN 0.9% NACL POUR BTL 1000ML (IV SOLUTION) IMPLANT
SOLN STERILE WATER BTL 1000 ML (IV SOLUTION) ×1 IMPLANT
SUT MNCRL AB 4-0 PS2 18 (SUTURE) ×1 IMPLANT
TOWEL GREEN STERILE FF (TOWEL DISPOSABLE) ×1 IMPLANT
TUBE CONNECTING 20X1/4 (TUBING) ×1 IMPLANT
TUBING ARTHROSCOPY IRRIG 16FT (MISCELLANEOUS) ×1 IMPLANT
WAND ABLATOR APOLLO I90 (BUR) IMPLANT
WRAP KNEE MAXI GEL POST OP (GAUZE/BANDAGES/DRESSINGS) IMPLANT

## 2024-01-23 NOTE — Transfer of Care (Signed)
 Immediate Anesthesia Transfer of Care Note  Patient: Albert Rivas  Procedure(s) Performed: Procedures (LRB): ARTHROSCOPY, KNEE, WITH MEDIAL MENISCECTOMY (Right)  Patient Location: PACU  Anesthesia Type: General  Level of Consciousness: awake, oriented, sedated and patient cooperative  Airway & Oxygen Therapy: Patient Spontanous Breathing and Patient connected to face mask oxygen  Post-op Assessment: Report given to PACU RN and Post -op Vital signs reviewed and stable  Post vital signs: Reviewed and stable  Complications: No apparent anesthesia complications  Last Vitals:  Vitals Value Taken Time  BP 93/60 01/23/24 14:00  Temp 36.4 C 01/23/24 14:00  Pulse 53 01/23/24 14:08  Resp 15 01/23/24 14:08  SpO2 97 % 01/23/24 14:08  Vitals shown include unfiled device data.  Last Pain:  Vitals:   01/23/24 1400  TempSrc:   PainSc: Asleep      Patients Stated Pain Goal: 3 (01/23/24 1034)  Complications: No notable events documented.

## 2024-01-23 NOTE — Op Note (Signed)
 Orthopaedic Surgery Operative Note (CSN: [id number])  Albert Rivas  1955-11-13 Date of Surgery: 01/23/2024   Diagnoses:  Right knee pain  Procedure: Right medial meniscectomy 29881   Operative Finding Full motion no limitation no instability.  Medial femoral condyle had grade 2 changes, complete radial tear of the posterior medial aspect of the medial meniscus with a parrot-beak component.  There was no ability to repair this.  30% total meniscal volume resected.  Chondroplasty performed.  We cleared all loose fragments from the joint.  Successful completion of the planned procedure.    Post-operative plan: The patient will be WBAT.  The patient will be discharged home.  DVT prophylaxis Aspirin  81 mg twice daily for 6 weeks.  Pain control with PRN pain medication preferring oral medicines.  Follow up plan will be scheduled in approximately 7 days for incision check.  Post-Op Diagnosis: Same Surgeons:Primary: Cristy Bonner DASEN, MD Assistants:Caroline McBane, PA-C Location: [facility] OR ROOM 1 Anesthesia: General with local Antibiotics: Ancef  2 g Tourniquet time:  Estimated Blood Loss: Minimal Complications: None Specimens: None Implants: * No implants in log *  Indications for Surgery:   Albert Rivas is a 68 y.o. male with meniscus tear and mechanical symptoms.  Benefits and risks of operative and nonoperative management were discussed prior to surgery with patient/guardian(s) and informed consent form was completed.  Specific risks including infection, need for additional surgery, postmeniscectomy syndrome, continued arthrosis and pain amongst others.   Procedure:   The patient was identified properly. Informed consent was obtained and the surgical site was marked. The patient was taken up to suite where general anesthesia was induced. The patient was placed in the supine position with a post against the surgical leg and a nonsterile tourniquet applied. The surgical leg was then prepped  and draped usual sterile fashion.  A standard surgical timeout was performed.  2 standard anterior portals were made and diagnostic arthroscopy performed. Please note the findings as noted above.  We used a shaver to perform a synovectomy of the anterior medial and anterolateral compartments as well as overgrowth along the patella.  We performed a medial meniscectomy using a shaver and basket back to a stable base removing all loose fragments.  Incisions closed with absorbable suture. The patient was awoken from general anesthesia and taken to the PACU in stable condition without complication.   Aleck Stalling, PA-C, present and scrubbed throughout the case, critical for completion in a timely fashion, and for retraction, instrumentation, closure.

## 2024-01-23 NOTE — Anesthesia Postprocedure Evaluation (Signed)
 Anesthesia Post Note  Patient: Albert Rivas  Procedure(s) Performed: ARTHROSCOPY, KNEE, WITH MEDIAL MENISCECTOMY (Right: Knee)     Patient location during evaluation: PACU Anesthesia Type: General Level of consciousness: awake and alert Pain management: pain level controlled Vital Signs Assessment: post-procedure vital signs reviewed and stable Respiratory status: spontaneous breathing, nonlabored ventilation, respiratory function stable and patient connected to nasal cannula oxygen Cardiovascular status: blood pressure returned to baseline and stable Postop Assessment: no apparent nausea or vomiting Anesthetic complications: no   No notable events documented.  Last Vitals:  Vitals:   01/23/24 1430 01/23/24 1519  BP: 122/71 127/75  Pulse: 66 69  Resp: 17 18  Temp:  (!) 36.3 C  SpO2: 94% 94%    Last Pain:  Vitals:   01/23/24 1519  TempSrc: Temporal  PainSc:                  Zayed Griffie L Hayes Czaja

## 2024-01-23 NOTE — Anesthesia Preprocedure Evaluation (Addendum)
 Anesthesia Evaluation  Patient identified by MRN, date of birth, ID band Patient awake    Reviewed: Allergy & Precautions, NPO status , Patient's Chart, lab work & pertinent test results  History of Anesthesia Complications (+) PONV and history of anesthetic complications  Airway Mallampati: II  TM Distance: >3 FB Neck ROM: Full    Dental no notable dental hx. (+) Teeth Intact, Dental Advisory Given   Pulmonary neg pulmonary ROS   Pulmonary exam normal breath sounds clear to auscultation       Cardiovascular negative cardio ROS Normal cardiovascular exam Rhythm:Regular Rate:Normal  HLD   Neuro/Psych negative neurological ROS  negative psych ROS   GI/Hepatic negative GI ROS, Neg liver ROS,,,  Endo/Other  negative endocrine ROS    Renal/GU negative Renal ROS  negative genitourinary   Musculoskeletal negative musculoskeletal ROS (+)    Abdominal   Peds  Hematology negative hematology ROS (+)   Anesthesia Other Findings   Reproductive/Obstetrics                              Anesthesia Physical Anesthesia Plan  ASA: 2  Anesthesia Plan: General   Post-op Pain Management: Tylenol  PO (pre-op)*   Induction: Intravenous  PONV Risk Score and Plan: 3 and Ondansetron , Dexamethasone , Midazolam  and Scopolamine  patch - Pre-op  Airway Management Planned: LMA  Additional Equipment:   Intra-op Plan:   Post-operative Plan: Extubation in OR  Informed Consent: I have reviewed the patients History and Physical, chart, labs and discussed the procedure including the risks, benefits and alternatives for the proposed anesthesia with the patient or authorized representative who has indicated his/her understanding and acceptance.     Dental advisory given  Plan Discussed with: CRNA  Anesthesia Plan Comments:          Anesthesia Quick Evaluation

## 2024-01-23 NOTE — Interval H&P Note (Signed)
 All questions answered, patient wants to proceed with procedure. ? ?

## 2024-01-23 NOTE — Anesthesia Procedure Notes (Signed)
 Procedure Name: LMA Insertion Date/Time: 01/23/2024 1:21 PM  Performed by: Delayne Olam BIRCH, CRNAPre-anesthesia Checklist: Patient identified, Emergency Drugs available, Suction available and Patient being monitored Patient Re-evaluated:Patient Re-evaluated prior to induction Oxygen Delivery Method: Circle system utilized Preoxygenation: Pre-oxygenation with 100% oxygen Induction Type: IV induction Ventilation: Mask ventilation without difficulty LMA: LMA inserted LMA Size: 4.0 Number of attempts: 1 Airway Equipment and Method: Bite block Placement Confirmation: positive ETCO2 Tube secured with: Tape Dental Injury: Teeth and Oropharynx as per pre-operative assessment

## 2024-01-24 ENCOUNTER — Encounter (HOSPITAL_BASED_OUTPATIENT_CLINIC_OR_DEPARTMENT_OTHER): Payer: Self-pay | Admitting: Orthopaedic Surgery
